# Patient Record
Sex: Male | Born: 1980 | Race: Black or African American | Hispanic: No | Marital: Married | Smoking: Current every day smoker
Health system: Southern US, Community
[De-identification: ages and names within clinical notes are randomized; demographics above are authoritative.]

## PROBLEM LIST (undated history)

## (undated) DIAGNOSIS — G510 Bell's palsy: Secondary | ICD-10-CM

## (undated) DIAGNOSIS — M779 Enthesopathy, unspecified: Secondary | ICD-10-CM

## (undated) HISTORY — DX: Bell's palsy: G51.0

---

## 2007-03-31 ENCOUNTER — Emergency Department (HOSPITAL_COMMUNITY): Admission: EM | Admit: 2007-03-31 | Discharge: 2007-03-31 | Payer: Self-pay | Admitting: Emergency Medicine

## 2008-05-17 ENCOUNTER — Emergency Department (HOSPITAL_COMMUNITY): Admission: EM | Admit: 2008-05-17 | Discharge: 2008-05-17 | Payer: Self-pay | Admitting: Emergency Medicine

## 2010-11-20 LAB — URINALYSIS, ROUTINE W REFLEX MICROSCOPIC
Bilirubin Urine: NEGATIVE
Glucose, UA: NEGATIVE
Hgb urine dipstick: NEGATIVE
Ketones, ur: NEGATIVE
Nitrite: NEGATIVE
Protein, ur: NEGATIVE
Specific Gravity, Urine: 1.029
Urobilinogen, UA: 1
pH: 6

## 2010-11-20 LAB — COMPREHENSIVE METABOLIC PANEL
ALT: 27
AST: 23
Alkaline Phosphatase: 49
CO2: 26
Chloride: 108
GFR calc Af Amer: >60
GFR calc non Af Amer: >60
Glucose, Bld: 87
Sodium: 138
Total Bilirubin: 1.5 — ABNORMAL HIGH

## 2010-11-20 LAB — CBC
Hemoglobin: 15
RBC: 4.83
WBC: 11.6 — ABNORMAL HIGH

## 2010-11-20 LAB — DIFFERENTIAL
Basophils Absolute: 0
Basophils Relative: 0
Eosinophils Absolute: 0.1
Eosinophils Relative: 1
Lymphocytes Relative: 9 — ABNORMAL LOW
Lymphs Abs: 1
Monocytes Absolute: 0.7
Monocytes Relative: 6
Neutro Abs: 9.7 — ABNORMAL HIGH
Neutrophils Relative %: 84 — ABNORMAL HIGH

## 2010-11-20 LAB — URINE MICROSCOPIC-ADD ON

## 2016-10-04 ENCOUNTER — Emergency Department (HOSPITAL_COMMUNITY): Payer: Self-pay

## 2016-10-04 ENCOUNTER — Encounter (HOSPITAL_COMMUNITY): Payer: Self-pay | Admitting: *Deleted

## 2016-10-04 ENCOUNTER — Emergency Department (HOSPITAL_COMMUNITY)
Admission: EM | Admit: 2016-10-04 | Discharge: 2016-10-04 | Disposition: A | Discharge status: Home / Self Care | Payer: Self-pay | Attending: Emergency Medicine | Admitting: Emergency Medicine

## 2016-10-04 DIAGNOSIS — M79672 Pain in left foot: Secondary | ICD-10-CM | POA: Insufficient documentation

## 2016-10-04 DIAGNOSIS — F17200 Nicotine dependence, unspecified, uncomplicated: Secondary | ICD-10-CM | POA: Insufficient documentation

## 2016-10-04 HISTORY — DX: Enthesopathy, unspecified: M77.9

## 2016-10-04 MED ORDER — MELOXICAM 15 MG PO TABS: 15.0000 mg | ORAL_TABLET | Freq: Every day | ORAL | 0 refills | Status: DC

## 2016-10-04 NOTE — Discharge Instructions (Signed)
Take Mobic once a day. Do not take other anti-inflammatories at the same time (Advil, Motrin, naproxen, Aleve, ibuprofen). You may supplement with Tylenol as needed. Continue to rest, ice, and elevate your foot. You may continue to use your ankle brace for support. Follow-up with orthopedics for further evaluation of your foot. Return to the emergency department if you develop fevers, chills, numbness, tingling, or any new or worsening symptoms.

## 2016-10-04 NOTE — ED Provider Notes (Signed)
[facility] Provider Note   CSN: [id number] Arrival date & time: 10/04/16  [id number]  By signing my name below, I, Vista Minkobert Ross, attest that this documentation has been prepared under the direction and in the presence of Brynn Mulgrew PA-C.  Electronically Signed: Vista Minkobert Ross, ED Scribe. 10/04/16. 10:18 AM.  History   Chief Complaint Chief Complaint  Patient presents with  . Foot Pain   HPI HPI Comments: Raymond Harrison is a 36 y.o. male who presents to the Emergency Department complaining of persistent sharp pain from the top of his left foot that started five days ago. 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 This caused his foot to roll (eversion). Pt took two ibuprofen after the incident with significant relief the same night but woke up in the morning with increased pain. He has also used an ankle brace and applied ice without relief. Pt has been taking ibuprofen every day since onset without relief. He reports an exacerbation of pain during ambulation and when bearing weight on the extremity. He does not have a current orthopedist to follow up with. Pt has Hx of ankle break in the same ankle, without need for surgical intervention. Pt denies any numbness or tingling in the toes or injury elsewhere.  The history is provided by the patient. No language interpreter was used.   Past Medical History:  Diagnosis Date  . Tendonitis    There are no active problems to display for this patient.  History reviewed. No pertinent surgical history.   Home Medications    Prior to Admission medications   Medication Sig Start Date End Date Taking? Authorizing Provider  meloxicam (MOBIC) 15 MG tablet Take 1 tablet (15 mg total) by mouth daily. 10/04/16   Takeya Marquis, PA-C    Family History History reviewed. No pertinent family history.  Social History Social History  Substance Use Topics  . Smoking status: Current Every Day Smoker  . Smokeless  tobacco: Not on file  . Alcohol use Yes     Comment: occ    Allergies   Patient has no known allergies.   Review of Systems Review of Systems  Musculoskeletal: Positive for arthralgias and gait problem.  Skin: Negative for color change, pallor and wound.  Neurological: Negative for weakness and numbness.     Physical Exam Updated Vital Signs BP 130/76 (BP Location: Right Arm)   Pulse 68   Temp 97.9 F (36.6 C) (Oral)   Resp 17   SpO2 96%   Physical Exam  Constitutional: He is oriented to person, place, and time. He appears well-developed and well-nourished. No distress.  HENT:  Head: Normocephalic and atraumatic.  Neck: Normal range of motion.  Pulmonary/Chest: Effort normal.  Musculoskeletal:       Left knee: Normal.       Left ankle: He exhibits no swelling, no deformity, no laceration and normal pulse. No lateral malleolus and no medial malleolus tenderness found. Achilles tendon normal.       Feet:  Patient without obvious swelling, contusion, laceration or injury of the left foot. Patient with full plantar and dorsal flexion active ROM without pain. Decreased eversion and inversion due to pain. Minimal tenderness to palpation of the dorsal medial aspect of the left foot. No pain in the ankle, lateral foot, or plantar foot. Full range of motion of the toes without pain. Pedal pulses intact bilaterally. Color and warmth equal bilaterally. Sensation intact bilaterally. Compartments soft.  Neurological: He is alert and oriented to person, place, and time.  Skin: Skin is warm and dry. He is not diaphoretic.  Psychiatric: He has a normal mood and affect. Judgment normal.  Nursing note and vitals reviewed.    ED Treatments / Results  DIAGNOSTIC STUDIES: Oxygen Saturation is 96% on RA, normal by my interpretation.  COORDINATION OF CARE: 10:16 AM-Discussed treatment plan with pt at bedside and pt agreed to plan.   Labs (all labs ordered are listed, but only abnormal  results are displayed) Labs Reviewed - No data to display  EKG  EKG Interpretation None      Radiology Dg Foot Complete Left  Result Date: 10/04/2016 CLINICAL DATA:  Left foot pain since an injury playing football 09/29/2016. Initial encounter. EXAM: LEFT FOOT - COMPLETE 3+ VIEW COMPARISON:  None. FINDINGS: There is no acute bony or joint abnormality. The patient has advanced for age degenerative change about the articulation of the navicular and medial cuneiform. There may be partial fusion across the articulation compatible with the coalition. Soft tissues are unremarkable. IMPRESSION: No acute finding. 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 Nonemergent consultation with orthopedics is recommended. Electronically Signed   By: Drusilla Kannerhomas  Dalessio M.D.   On: 10/04/2016 10:02   Procedures Procedures (including critical care time)  Medications Ordered in ED Medications - No data to display   Initial Impression / Assessment and Plan / ED Course  I have reviewed the triage vital signs and the nursing notes.  Pertinent labs & imaging results that were available during my care of the patient were reviewed by me and considered in my medical decision making (see chart for details).     Patient presenting with pain of the left foot. Physical exam showed tenderness of the medial dorsal ankle, with patient able to range his foot and toes. No surrounding erythema, swelling or signs of infection. Patient neurovascularly intact with soft compartments. 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 Discussed findings with patient. Will treat conservatively with NSAIDs. Patient already using a brace, and states that he can continue to use his own instead of getting one here. Patient to follow-up with orthopedics. Return precautions given. Patient states he  understands and agrees to plan.  Final Clinical Impressions(s) / ED Diagnoses   Final diagnoses:  Left foot pain    New Prescriptions Discharge Medication List as of 10/04/2016 10:27 AM    START taking these medications   Details  meloxicam (MOBIC) 15 MG tablet Take 1 tablet (15 mg total) by mouth daily., Starting Sun 10/04/2016, Print      I personally performed the services described in this documentation, which was scribed in my presence. The recorded information has been reviewed and is accurate.    Alveria ApleyCaccavale, Shyne Resch, PA-C 10/04/16 1112    Rolan BuccoBelfi, Melanie, MD 10/04/16 [phone]

## 2016-10-04 NOTE — ED Triage Notes (Signed)
Pt reports having left foot pain since Tuesday. 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 Now has difficulty bearing weight and no relief with tylenol and ibuprofen.

## 2016-10-21 ENCOUNTER — Ambulatory Visit (INDEPENDENT_AMBULATORY_CARE_PROVIDER_SITE_OTHER): Payer: Self-pay | Admitting: Family Medicine

## 2016-10-21 ENCOUNTER — Encounter: Payer: Self-pay | Admitting: Sports Medicine

## 2016-10-21 VITALS — BP 124/82 | Ht 70.0 in | Wt 217.0 lb

## 2016-10-21 DIAGNOSIS — M216X1 Other acquired deformities of right foot: Secondary | ICD-10-CM

## 2016-10-21 DIAGNOSIS — Q6689 Other  specified congenital deformities of feet: Secondary | ICD-10-CM

## 2016-10-21 DIAGNOSIS — M216X2 Other acquired deformities of left foot: Secondary | ICD-10-CM

## 2016-10-21 NOTE — Progress Notes (Signed)
Chief complaint: Left foot pain 2 months  History of present illness: Raymond Harrison is a 36 year old male who presents to the sports medicine office today with chief complaint of left foot pain. He reports symptoms have been ongoing for the last 2 months. He reports of gradual, progressive worsening of left dorsal foot pain during this time. He is not report of inciting incident, trauma, or injury to explain the pain. He describes the pain as a sharp and throbbing pain. Today, he rates the pain as a 2/10. He does not report of any radiation of pain. He reports his pain to the medial aspect of his left foot over the navicular bone. He does not report of any warmth, erythema, ecchymosis, or effusion. 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 He reports that he tried to manage the pain on his own with rest, ice, and ibuprofen use, but pain became so severe that he did have to go to the emergency department on 10/04/16. In the emergency department note, it was mentioned he had forced eversion injury. He does not report of any such incidence. 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 It was recommended for him to follow-up with sports medicine for further evaluation. He reports that he did play football throughout high school, reports having minor sprains, but no major foot or ankle injuries. He does not report of any numbness, tingling, or weakness in his left lower extremity. Has tried an ankle brace, describes a lace up ankle brace, with no improvement in his symptoms.  His past medical history, past surgical history, family history, social history were reviewed  and updated in the chart. No notable medical conditions, no previous surgeries on his feet or ankle, he does report of current tobacco use.  Allergies: No known drug allergies  Review of systems: As stated above  Physical exam: Vital signs reviewed and are documented in chart General: Alert, oriented, appears stated age, in no apparent distress HEENT: Moist oral mucosa Respiratory: Normal respirations, able to speak in full sentences Cardiac: Normal peripheral pulses, regular rate Integumentary: No rashes on visible skin Neurologic: Strength 5/5 in bilateral lower extremities, sensation 2+ in bilateral lower extremities Psychiatric: Appropriate affect and mood Musculoskeletal: Inspection of the foot reveals no obvious deformity or muscular atrophy, no warmth, erythema, ecchymosis, or effusion noted, he is slightly tender to deep palpation over the cuneiforms and navicular on dorsal aspect, no tenderness along medial aspect of foot as well as plantar aspect of left foot, no tenderness over proximal second metatarsal head, no tenderness over base of fifth metatarsal, he does have full active and passive range of motion with ankle dorsiflexion, plantar flexion, inversion, eversion, anterior drawer negative, talar tilt negative, on gait analysis he does have pes cavus bilaterally as well as supination bilaterally  I did personally review x-ray imaging from the emergency department. Appears that he does have possible coalition of the navicular and medial cuneiform. In the absence of any notable trauma or injury, have low suspicion that this represents a fracture. An incidental finding he does have os cuboid.  Assessment and plan:  1. Left foot pain,  with XR findings of navicular-medial cuneiform coalition 2. Bilateral pes cavus, with supination  Left foot pain -Discussed with patient x-ray findings of navicular-medial cuneiform coalition, discussed that this is abnormal bony or ligamentous  fusion, this most likely has been present since birth and is not a new bony abnormality -Discussed conservative management, did fit patient today with green sport insoles with lateral heel wedge -Discussed in the absence of trauma and slight improvement in his symptoms since emergency department visit, discussed he does not need any additional imaging at this time -I discussed with him if the pain worsens or does not improve with the insoles in 4 weeks, will obtain CT imaging of his left foot -I discussed with patient to use Aleve as needed for pain  Will have patient return in 4 weeks for the above or sooner as needed.    Haynes Kerns, MD Primary Care Sports Medicine Fellow [facility]

## 2016-10-21 NOTE — Patient Instructions (Signed)
Please wear your orthotics at all times. Remove the insoles in your shoes and place this in there. You can take Aleve as needed for pain. If you are not getting better in 4 weeks, we will get CT imaging of your left foot.

## 2016-11-18 ENCOUNTER — Ambulatory Visit (INDEPENDENT_AMBULATORY_CARE_PROVIDER_SITE_OTHER): Payer: Self-pay | Admitting: Family Medicine

## 2016-11-18 VITALS — BP 106/70 | Ht 70.0 in | Wt 215.0 lb

## 2016-11-18 DIAGNOSIS — Q6689 Other  specified congenital deformities of feet: Secondary | ICD-10-CM

## 2016-11-18 NOTE — Progress Notes (Signed)
Chief complaint: Follow-up the left foot pain 3 months  History of present illness: Sheikh is a 36 year old male who presents to the sports medicine office today for follow-up of left foot pain. He was here approximately 1 month ago for left dorsal foot pain. At that time, he was diagnosed with navicular-medial cuneiform coalition, as seen on x-ray from emergency department visit. Ultimately discussed conservative management, with green sport insoles and lateral heel wedge.  Today, he reports significant improvement in his symptoms. He is not report of any pain today. 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 He reports that he has been wearing the green insoles, has not noted any issues. He reports occasionally will feel a slight twinge on the lateral aspect with inversion, but he notes that he is wearing tightfitting shoes, does not notices when he is wearing more wider fitting shoes. He reports that he is planning to get more wider fitting shoes. No numbness, tingling, or weakness.    Review of systems:  As stated above  Physical exam: Vital signs are reviewed and are documented in the chart Gen.: Alert, oriented, appears stated age, in no apparent distress HEENT: Moist oral mucosa Respiratory: Normal respirations, able to speak in full sentences Cardiac: Regular rate, distal pulses 2+ Integumentary: No rashes on visible skin:  Neurologic: Strength 5/5, sensation 2+ in bilateral lower extremities Psych: Normal affect, mood is described as good Musculoskeletal: Inspection of his left foot reveals no obvious deformity or muscle atrophy, no warmth, erythema, ecchymosis, or effusion, he is not tender to palpation today over the cuneiform and navicular bone on dorsal aspect, no pain with ambulation, has full range of motion, do note that he has pes cavus bilaterally and supination with ambulation  Assessment and plan: 1. Left foot pain, with x-ray findings of  navicular-medial cuneiform coalition, resolved  Left foot pain -Discussed to continue wearing green insoles -Discussed option of custom orthotics if he would like to have this done at a later time  Will have patient follow-up on as-needed basis.    Haynes Kerns, M.D. Primary Care Sports Medicine Fellow [facility]

## 2016-11-18 NOTE — Patient Instructions (Signed)
I am glad to hear that you are doing better. Still free to call the office if he would like to have custom orthotics made or have any other orthopedic issues you would like to address.

## 2017-01-25 ENCOUNTER — Encounter (HOSPITAL_COMMUNITY): Payer: Self-pay | Admitting: Emergency Medicine

## 2017-01-25 ENCOUNTER — Ambulatory Visit (HOSPITAL_COMMUNITY)
Admission: EM | Admit: 2017-01-25 | Discharge: 2017-01-25 | Disposition: A | Discharge status: Home / Self Care | Payer: Self-pay | Attending: Physician Assistant | Admitting: Physician Assistant

## 2017-01-25 DIAGNOSIS — R6889 Other general symptoms and signs: Secondary | ICD-10-CM

## 2017-01-25 DIAGNOSIS — B349 Viral infection, unspecified: Secondary | ICD-10-CM

## 2017-01-25 MED ORDER — BENZONATATE 100 MG PO CAPS: 100.0000 mg | ORAL_CAPSULE | Freq: Three times a day (TID) | ORAL | 0 refills | Status: DC

## 2017-01-25 MED ORDER — PREDNISONE 10 MG PO TABS: 40.0000 mg | ORAL_TABLET | Freq: Every day | ORAL | 0 refills | Status: AC

## 2017-01-25 NOTE — ED Provider Notes (Signed)
[facility]    CSN: [id number] Arrival date & time: 01/25/17  1109     History   Chief Complaint Chief Complaint  Patient presents with  . Cough  . Generalized Body Aches    HPI Raymond Harrison is a 36 y.o. male.   36 year-old male, presenting today due to cough and body aches. Symptoms started 4 days ago. He has had nasal congestion, body aches, nonproductive cough, subjective fever and chills. 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  Influenza  Presenting symptoms: cough, fatigue, myalgias and rhinorrhea   Presenting symptoms: no fever, no shortness of breath, no sore throat and no vomiting   Severity:  Moderate Onset quality:  Gradual Duration:  4 days Progression:  Unchanged Chronicity:  New Relieved by:  Nothing Worsened by:  Nothing Ineffective treatments:  None tried Associated symptoms: chills, decreased appetite, decreased physical activity and nasal congestion   Associated symptoms: no ear pain, no mental status change, no neck stiffness and no syncope   Risk factors: sick contacts   Risk factors: not elderly, no diabetes problem, no heart disease and no immunocompromised state     Past Medical History:  Diagnosis Date  . Tendonitis     There are no active problems to display for this patient.   History reviewed. No pertinent surgical history.     Home Medications    Prior to Admission medications   Medication Sig Start Date End Date Taking? Authorizing Provider  benzonatate (TESSALON) 100 MG capsule Take 1 capsule (100 mg total) by mouth every 8 (eight) hours. 01/25/17   Aubrii Sharpless C, PA-C  meloxicam (MOBIC) 15 MG tablet Take 1 tablet (15 mg total) by mouth daily. Patient not taking: Reported on 10/21/2016 10/04/16   Caccavale, Sophia, PA-C  predniSONE  (DELTASONE) 10 MG tablet Take 4 tablets (40 mg total) by mouth daily for 5 days. 01/25/17 01/30/17  Alecia LemmingBlue, Jamiesha Victoria C, PA-C    Family History History reviewed. No pertinent family history.  Social History Social History   Tobacco Use  . Smoking status: Current Every Day Smoker  Substance Use Topics  . Alcohol use: Yes    Comment: occ  . Drug use: No     Allergies   Patient has no known allergies.   Review of Systems Review of Systems  Constitutional: Positive for chills, decreased appetite and fatigue. Negative for fever.  HENT: Positive for congestion and rhinorrhea. Negative for ear pain and sore throat.   Eyes: Negative for pain and visual disturbance.  Respiratory: Positive for cough. Negative for shortness of breath.   Cardiovascular: Negative for chest pain and palpitations.  Gastrointestinal: Negative for abdominal pain and vomiting.  Genitourinary: Negative for dysuria and hematuria.  Musculoskeletal: Positive for myalgias. Negative for arthralgias, back pain and neck stiffness.  Skin: Negative for color change and rash.  Neurological: Negative for seizures and syncope.  All other systems reviewed and are negative.    Physical Exam Triage Vital Signs ED Triage Vitals [01/25/17 1135]  Enc Vitals Group     BP (!) 124/99     Pulse Rate 72     Resp 18     Temp 98.3 F (36.8 C)     Temp Source Oral     SpO2 99 %  Weight      Height      Head Circumference      Peak Flow      Pain Score      Pain Loc      Pain Edu?      Excl. in GC?    No data found.  Updated Vital Signs BP (!) 124/99 (BP Location: Left Arm)   Pulse 72   Temp 98.3 F (36.8 C) (Oral)   Resp 18   SpO2 99%   Visual Acuity Right Eye Distance:   Left Eye Distance:   Bilateral Distance:    Right Eye Near:   Left Eye Near:    Bilateral Near:     Physical Exam  Constitutional: He appears well-developed and well-nourished.  HENT:  Head: Normocephalic and atraumatic.  Right Ear:  Hearing, tympanic membrane, external ear and ear canal normal.  Left Ear: Hearing, tympanic membrane, external ear and ear canal normal.  Nose: Nose normal.  Mouth/Throat: Uvula is midline and oropharynx is clear and moist.  Eyes: Conjunctivae are normal.  Neck: Neck supple.  Cardiovascular: Normal rate and regular rhythm.  No murmur heard. Pulmonary/Chest: Effort normal and breath sounds normal. No stridor. No respiratory distress. He has no decreased breath sounds. He has no wheezes. He has no rhonchi. He has no rales.  Abdominal: Soft. There is no tenderness.  Musculoskeletal: He exhibits no edema.  Neurological: He is alert.  Skin: Skin is warm and dry.  Psychiatric: He has a normal mood and affect.  Nursing note and vitals reviewed.    UC Treatments / Results  Labs (all labs ordered are listed, but only abnormal results are displayed) Labs Reviewed - No data to display  EKG  EKG Interpretation None       Radiology No results found.  Procedures Procedures (including critical care time)  Medications Ordered in UC Medications - No data to display   Initial Impression / Assessment and Plan / UC Course  I have reviewed the triage vital signs and the nursing notes.  Pertinent labs & imaging results that were available during my care of the patient were reviewed by me and considered in my medical decision making (see chart for details).     Flu-like symptoms x 4 days Outside of the window for tamiflu Will treat symptomatically for viral syndrome   Final Clinical Impressions(s) / UC Diagnoses   Final diagnoses:  Flu-like symptoms  Viral syndrome    ED Discharge Orders        Ordered    benzonatate (TESSALON) 100 MG capsule  Every 8 hours     01/25/17 1218    predniSONE (DELTASONE) 10 MG tablet  Daily     01/25/17 1218       Controlled Substance Prescriptions [place] Controlled Substance Registry consulted? Not Applicable   Alecia LemmingBlue, Obadiah Dennard C, [place]PA-C 01/25/17  1243

## 2017-01-25 NOTE — ED Triage Notes (Signed)
Pt sts cough and body aches x 4 days 

## 2017-05-20 ENCOUNTER — Encounter (HOSPITAL_COMMUNITY): Payer: Self-pay

## 2017-05-20 ENCOUNTER — Emergency Department (HOSPITAL_COMMUNITY)
Admission: EM | Admit: 2017-05-20 | Discharge: 2017-05-20 | Disposition: A | Discharge status: Home / Self Care | Payer: BLUE CROSS/BLUE SHIELD | Attending: Emergency Medicine | Admitting: Emergency Medicine

## 2017-05-20 ENCOUNTER — Other Ambulatory Visit: Payer: Self-pay

## 2017-05-20 DIAGNOSIS — Z041 Encounter for examination and observation following transport accident: Secondary | ICD-10-CM | POA: Insufficient documentation

## 2017-05-20 DIAGNOSIS — Z79899 Other long term (current) drug therapy: Secondary | ICD-10-CM | POA: Diagnosis not present

## 2017-05-20 DIAGNOSIS — F17200 Nicotine dependence, unspecified, uncomplicated: Secondary | ICD-10-CM | POA: Diagnosis not present

## 2017-05-20 MED ORDER — CYCLOBENZAPRINE HCL 10 MG PO TABS: 10.0000 mg | ORAL_TABLET | Freq: Two times a day (BID) | ORAL | 0 refills | Status: DC | PRN

## 2017-05-20 NOTE — ED Notes (Signed)
Pt ambulated to room without difficulty

## 2017-05-20 NOTE — Discharge Instructions (Addendum)
Please read attached information. If you experience any new or worsening signs or symptoms please return to the emergency room for evaluation. Please follow-up with your primary care provider or specialist as discussed. Please use medication prescribed only as directed and discontinue taking if you have any concerning signs or symptoms.   °
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
## 2018-12-13 IMAGING — DX DG FOOT COMPLETE 3+V*L*
3 series · 3 of 3 positions shown · non-contrast
Comparison: None.

CLINICAL DATA: Left foot pain since an injury playing football
09/29/2016. Initial encounter.

EXAM:
LEFT FOOT - COMPLETE 3+ VIEW

[foot ap]
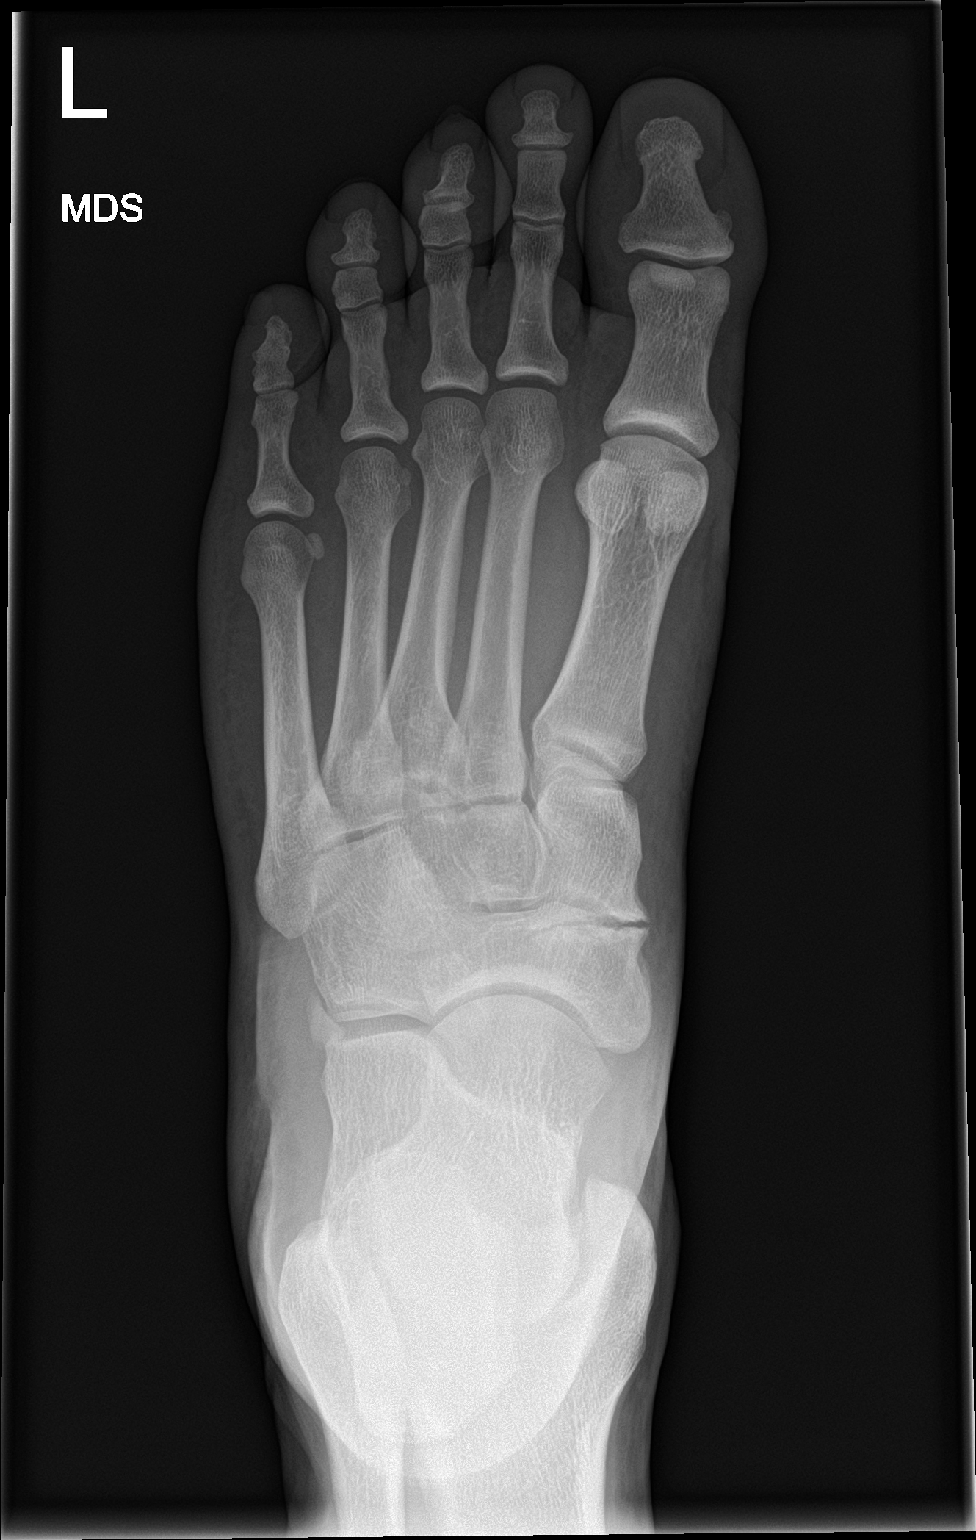

[foot obl]
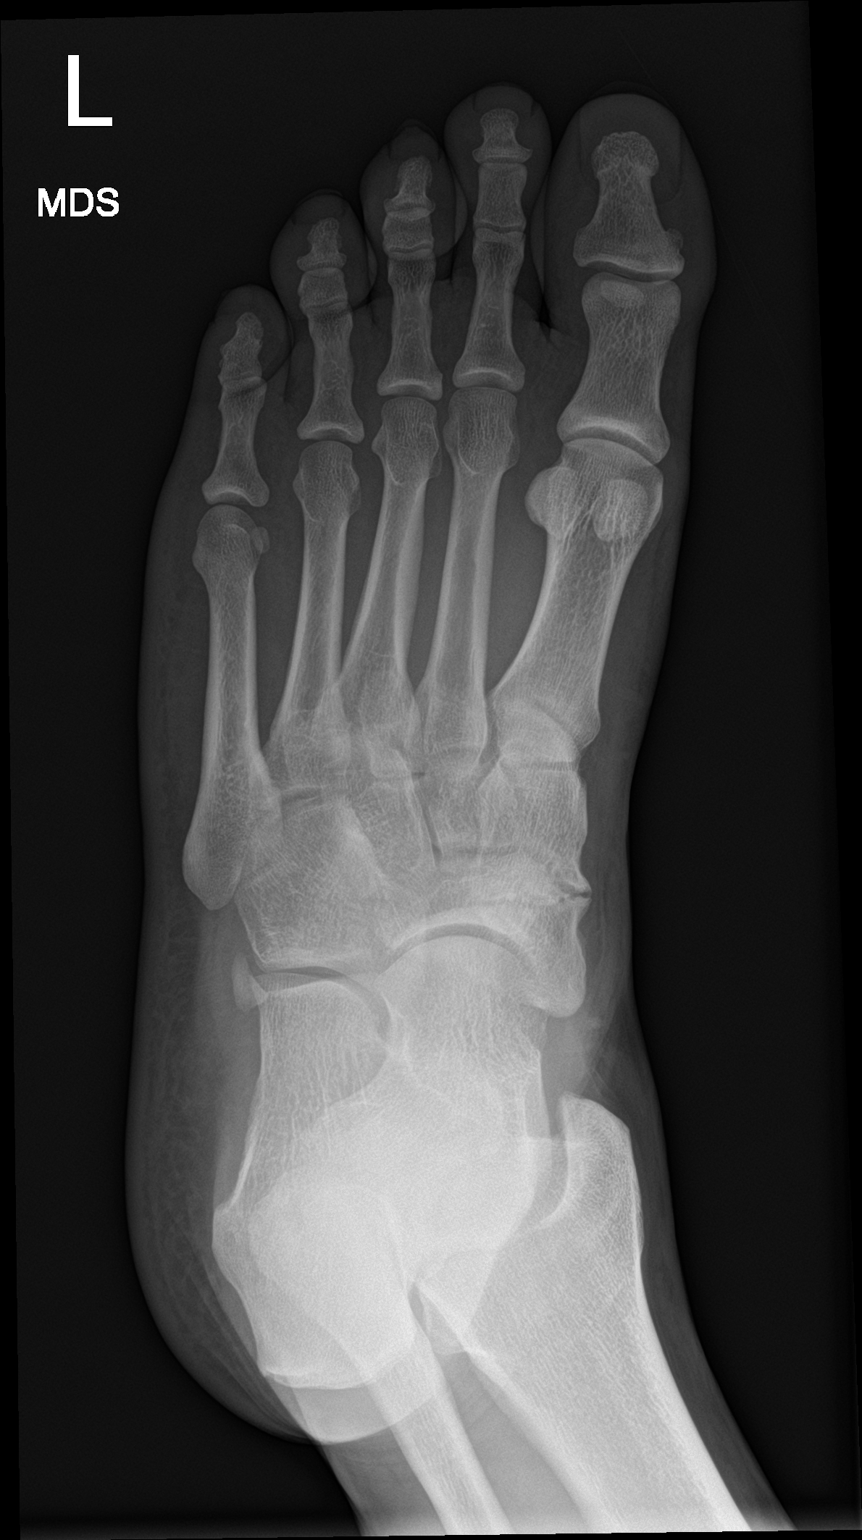

[foot lat]
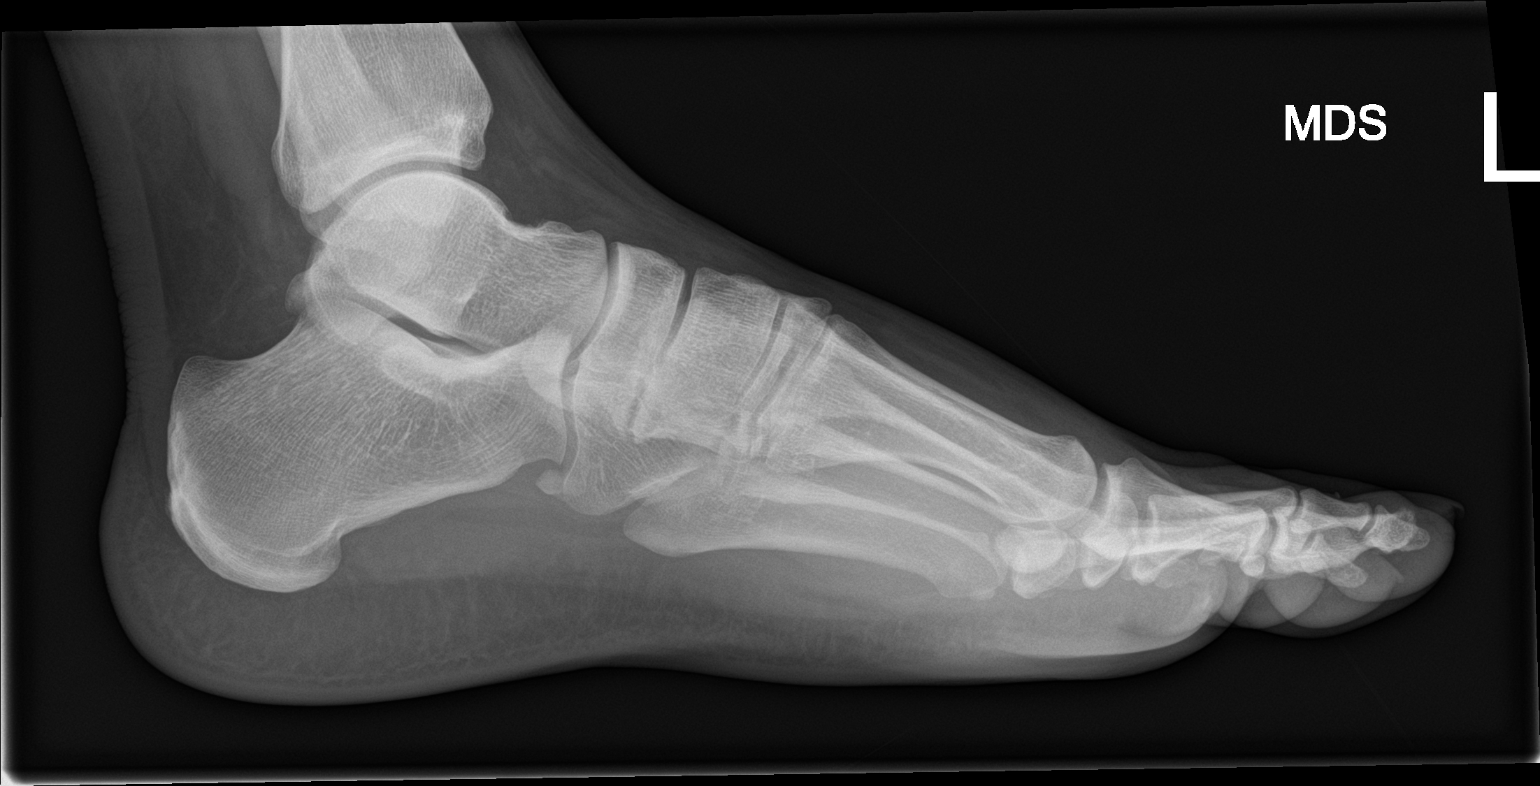

[3 of 3 positions shown; findings below may reference images not displayed]

FINDINGS: There is no acute bony or joint abnormality. The patient has
advanced for age degenerative change about the articulation of the
navicular and medial cuneiform. There may be partial fusion across
the articulation compatible with the coalition. Soft tissues are
unremarkable.
IMPRESSION: No acute finding.

Possible coalition of the navicular and medial cuneiform with
secondary advanced for age degenerative disease about the joint.
Nonemergent consultation with orthopedics is recommended.

## 2021-03-17 ENCOUNTER — Emergency Department (HOSPITAL_COMMUNITY)
Admission: EM | Admit: 2021-03-17 | Discharge: 2021-03-18 | Discharge status: Against Medical Advice | Payer: BLUE CROSS/BLUE SHIELD | Attending: Student | Admitting: Student

## 2021-03-17 DIAGNOSIS — Z5321 Procedure and treatment not carried out due to patient leaving prior to being seen by health care provider: Secondary | ICD-10-CM | POA: Insufficient documentation

## 2021-03-17 DIAGNOSIS — R519 Headache, unspecified: Secondary | ICD-10-CM | POA: Insufficient documentation

## 2021-03-17 DIAGNOSIS — Z20822 Contact with and (suspected) exposure to covid-19: Secondary | ICD-10-CM | POA: Insufficient documentation

## 2021-03-17 DIAGNOSIS — R221 Localized swelling, mass and lump, neck: Secondary | ICD-10-CM | POA: Insufficient documentation

## 2021-03-17 DIAGNOSIS — M542 Cervicalgia: Secondary | ICD-10-CM | POA: Insufficient documentation

## 2021-03-18 LAB — RESP PANEL BY RT-PCR (FLU A&B, COVID) ARPGX2
Influenza A by PCR: NEGATIVE
Influenza B by PCR: NEGATIVE
SARS Coronavirus 2 by RT PCR: NEGATIVE

## 2021-03-18 MED ORDER — ACETAMINOPHEN 325 MG PO TABS: 650.0000 mg | ORAL_TABLET | Freq: Once | ORAL | Status: DC

## 2021-03-18 NOTE — ED Provider Triage Note (Signed)
Emergency Medicine Provider Triage Evaluation Note  Raymond Harrison , a 41 y.o. male  was evaluated in triage.  Pt complains of neck pain & headache. Notes a small painful mass to the right side of his neck which he thinks is causing a headache. No alleviating/aggravating factors.   Review of Systems  Positive: Headache, neck pain.  Negative: Fever, sore throat, ear pain, vomiting   Physical Exam  BP 128/88    Pulse 75    Temp 98.6 F (37 C) (Oral)    Resp 16    SpO2 98%  Gen:   Awake, no distress   Resp:  Normal effort  MSK:   Moves extremities without difficulty Neck:   No overlying skin erythema or increased warmth. No meningismus.   Medical Decision Making  Medically screening exam initiated at 12:10 AM.  Appropriate orders placed.  Raymond Harrison was informed that the remainder of the evaluation will be completed by another provider, this initial triage assessment does not replace that evaluation, and the importance of remaining in the ED until their evaluation is complete.  Headache.    Cherly Anderson, PA-C 03/18/21 0013

## 2021-03-18 NOTE — ED Notes (Signed)
Patient states he didn't want to wait and is leaving

## 2021-03-18 NOTE — ED Triage Notes (Signed)
Patient arrives POV c/o lump on right side of neck that is giving him a headache. Patient states he took advil/tylenol for headache Denies any sorethroat, stuffy nose or cough

## 2022-02-04 ENCOUNTER — Other Ambulatory Visit: Payer: Self-pay

## 2022-02-04 ENCOUNTER — Emergency Department (HOSPITAL_COMMUNITY)
Admission: EM | Admit: 2022-02-04 | Discharge: 2022-02-04 | Disposition: A | Discharge status: Home / Self Care | Payer: Self-pay | Attending: Emergency Medicine | Admitting: Emergency Medicine

## 2022-02-04 ENCOUNTER — Encounter (HOSPITAL_COMMUNITY): Payer: Self-pay

## 2022-02-04 DIAGNOSIS — G510 Bell's palsy: Secondary | ICD-10-CM | POA: Insufficient documentation

## 2022-02-04 MED ORDER — PREDNISONE 20 MG PO TABS: ORAL_TABLET | ORAL | 0 refills | Status: DC

## 2022-02-04 MED ORDER — HYPROMELLOSE (GONIOSCOPIC) 2.5 % OP SOLN: 1.0000 [drp] | Freq: Three times a day (TID) | OPHTHALMIC | 12 refills | Status: DC

## 2022-02-04 MED ORDER — VALACYCLOVIR HCL 1 G PO TABS: 1000.0000 mg | ORAL_TABLET | Freq: Three times a day (TID) | ORAL | 0 refills | Status: DC

## 2022-02-04 NOTE — ED Provider Notes (Signed)
[facility] EMERGENCY DEPARTMENT Provider Note   CSN: [id number] Arrival date & time: 02/04/22  1223     History  Chief Complaint  Patient presents with   Facial Droop    Raymond Harrison is a 41 y.o. male.  The history is provided by the patient and medical records. No language interpreter was used.     41 year old male without any significant past medical history presenting complaints of facial droop.  Patient reported noticed right-sided facial droop for the past 3 days.  Does notice having some trouble with swallowing as he is unable to close his mouth appropriately.  He endorsed some tingling sensation to the right side of his face.  He denies any significant headache, vision changes, confusion, focal numbness or focal weakness or neck pain.  No recent sickness.  Denies tobacco or alcohol use.  No other complaint.  Home Medications Prior to Admission medications   Medication Sig Start Date End Date Taking? Authorizing Provider  benzonatate (TESSALON) 100 MG capsule Take 1 capsule (100 mg total) by mouth every 8 (eight) hours. 01/25/17   Blue, Olivia C, PA-C  cyclobenzaprine (FLEXERIL) 10 MG tablet Take 1 tablet (10 mg total) by mouth 2 (two) times daily as needed for muscle spasms. 05/20/17   Hedges, Tinnie Gens, PA-C  meloxicam (MOBIC) 15 MG tablet Take 1 tablet (15 mg total) by mouth daily. Patient not taking: Reported on 10/21/2016 10/04/16   Caccavale, Jeanette Caprice, PA-C      Allergies    Patient has no known allergies.    Review of Systems   Review of Systems  All other systems reviewed and are negative.   Physical Exam Updated Vital Signs BP 115/81   Pulse 76   Temp 97.9 F (36.6 C)   Resp 16   Ht 5\' 10"  (1.778 m)   Wt 107 kg   SpO2 100%   BMI 33.86 kg/m  Physical Exam Vitals and nursing note reviewed.  Constitutional:      General: He is not in acute distress.    Appearance: He is well-developed.  HENT:     Head: Atraumatic.  Eyes:      Conjunctiva/sclera: Conjunctivae normal.  Cardiovascular:     Rate and Rhythm: Normal rate and regular rhythm.     Pulses: Normal pulses.     Heart sounds: Normal heart sounds.  Pulmonary:     Effort: Pulmonary effort is normal.     Breath sounds: Normal breath sounds.  Abdominal:     Palpations: Abdomen is soft.     Tenderness: There is no abdominal tenderness.  Musculoskeletal:        General: Normal range of motion.     Cervical back: Neck supple.  Skin:    Findings: No rash.  Neurological:     Mental Status: He is alert and oriented to person, place, and time.     Comments: Neurologic exam:  Speech clear, pupils equal round reactive to light, extraocular movements intact  Normal peripheral visual fields Cranial nerves III through XII grossly intact except R side facial droop involving forehead Follows commands, moves all extremities x4, normal strength to bilateral upper and lower extremities at all major muscle groups including grip Sensation normal to light touch and pinprick Coordination intact, no limb ataxia, finger-nose-finger normal Rapid alternating movements normal No pronator drift Gait normal   Psychiatric:        Mood and Affect: Mood normal.     ED Results / Procedures / Treatments  Labs (all labs ordered are listed, but only abnormal results are displayed) Labs Reviewed - No data to display  EKG None  Radiology No results found.  Procedures Procedures    Medications Ordered in ED Medications - No data to display  ED Course/ Medical Decision Making/ A&P                           Medical Decision Making  BP 115/81   Pulse 76   Temp 97.9 F (36.6 C)   Resp 16   Ht 5\' 10"  (1.778 m)   Wt 107 kg   SpO2 100%   BMI 33.86 kg/m   19:53 PM   41 year old male without any significant past medical history presenting complaints of facial droop.  Patient reported noticed right-sided facial droop for the past 3 days.  Does notice having some  trouble with swallowing as he is unable to close his mouth appropriately.  He endorsed some tingling sensation to the right side of his face.  He denies any significant headache, vision changes, confusion, focal numbness or focal weakness or neck pain.  No recent sickness.  Denies tobacco or alcohol use.  No other complaint.  On exam this is a well-appearing male sitting comfortably in bed appears to be in no acute discomfort.  His exam is notable for right-sided facial droop involving the forehead with some diminished sensation.  He is able to blink.  He does not have any other neurodeficit.  He has equal strength throughout.  His symptoms are suggestive of Bell's palsy.  I have considered stroke but felt that this is not likely.  I also discussed care with attending Dr. Roderic Palau who has seen evaluate patient and agrees.  Will discharge home with steroid, antiviral medication, as well as outpatient follow-up with neurology for further care.  Encourage patient to use artificial tears drops to his eyes to decrease risks of eye dryness.  I gave patient strict return precaution.  I have considered obtaining labs, brain MRI to rule out stroke but his symptoms is suggestive of Bell's palsy.  I have considered patient social determinant of health which includes tobacco use and recommend cessation.  I have reviewed patient EMR and considered in my plan of care.        Final Clinical Impression(s) / ED Diagnoses Final diagnoses:  Bell's palsy    Rx / DC Orders ED Discharge Orders          Ordered    predniSONE (DELTASONE) 20 MG tablet        02/04/22 1409    valACYclovir (VALTREX) 1000 MG tablet  3 times daily        02/04/22 1409    hydroxypropyl methylcellulose / hypromellose (ISOPTO TEARS / GONIOVISC) 2.5 % ophthalmic solution  3 times daily        02/04/22 1409              Domenic Moras, PA-C 02/04/22 1410    Milton Ferguson, MD 02/08/22 [phone]

## 2022-02-04 NOTE — ED Triage Notes (Signed)
Pt arrived POV from home c/o left sided facial droop since Monday. Pt denies any pain, numbness or tingling. Pt denies any other neuro symptoms.

## 2022-02-19 ENCOUNTER — Encounter: Payer: Self-pay | Admitting: Neurology

## 2022-02-19 ENCOUNTER — Ambulatory Visit: Payer: BC Managed Care – PPO | Admitting: Neurology

## 2022-02-19 VITALS — BP 137/82 | HR 74 | Ht 70.0 in | Wt 242.0 lb

## 2022-02-19 DIAGNOSIS — G510 Bell's palsy: Secondary | ICD-10-CM
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
## 2022-02-19 NOTE — Patient Instructions (Signed)
Return if worse

## 2022-04-16 ENCOUNTER — Ambulatory Visit (HOSPITAL_COMMUNITY)
Admission: EM | Admit: 2022-04-16 | Discharge: 2022-04-16 | Disposition: A | Discharge status: Home / Self Care | Payer: BC Managed Care – PPO | Attending: Emergency Medicine | Admitting: Emergency Medicine

## 2022-04-16 ENCOUNTER — Encounter (HOSPITAL_COMMUNITY): Payer: Self-pay

## 2022-04-16 DIAGNOSIS — H9202 Otalgia, left ear: Secondary | ICD-10-CM | POA: Diagnosis not present

## 2022-04-16 MED ORDER — CYCLOBENZAPRINE HCL 10 MG PO TABS: 10.0000 mg | ORAL_TABLET | Freq: Every day | ORAL | 0 refills | Status: DC

## 2022-04-16 MED ORDER — NAPROXEN 500 MG PO TABS: 500.0000 mg | ORAL_TABLET | Freq: Two times a day (BID) | ORAL | 0 refills | Status: DC

## 2022-04-16 MED ORDER — FLUTICASONE PROPIONATE 50 MCG/ACT NA SUSP: 2.0000 | Freq: Every day | NASAL | 0 refills | Status: DC

## 2022-04-16 NOTE — ED Triage Notes (Signed)
Pt states ringing in his left ear and pain for the past 2 days.

## 2022-04-16 NOTE — ED Provider Notes (Signed)
HPI  SUBJECTIVE:  Raymond Harrison is a 42 y.o. male who presents with 1.5 days of constant, pulsating left ear pain.  He states that things started off with a left-sided sore throat/scratchiness, but has now migrated to his ear.  The sore throat has resolved.  He reports a left-sided headache.  No change in hearing, otorrhea, tinnitus, sensation of fluid in the ear, vertigo, fevers, nasal congestion, rhinorrhea or allergy symptoms.  No recent swimming, foreign body insertion.  No popping or clicking of the jaw.  He grinds his teeth at night.  No antibiotics in the past month.  No antipyretic in the past 6 hours.  He tried sleeping without improvement in his symptoms.  No aggravating factors.  He has a past medical history of Bell's palsy on the right side and seasonal allergies.  PCP: None.   Past Medical History:  Diagnosis Date   Tendonitis     History reviewed. No pertinent surgical history.  History reviewed. No pertinent family history.  Social History   Tobacco Use   Smoking status: Every Day   Smokeless tobacco: Never  Substance Use Topics   Alcohol use: Yes    Comment: occ   Drug use: No    No current facility-administered medications for this encounter.  Current Outpatient Medications:    cyclobenzaprine (FLEXERIL) 10 MG tablet, Take 1 tablet (10 mg total) by mouth at bedtime., Disp: 20 tablet, Rfl: 0   fluticasone (FLONASE) 50 MCG/ACT nasal spray, Place 2 sprays into both nostrils daily., Disp: 16 g, Rfl: 0   naproxen (NAPROSYN) 500 MG tablet, Take 1 tablet (500 mg total) by mouth 2 (two) times daily., Disp: 20 tablet, Rfl: 0   hydroxypropyl methylcellulose / hypromellose (ISOPTO TEARS / GONIOVISC) 2.5 % ophthalmic solution, Place 1 drop into both eyes 3 (three) times daily., Disp: 15 mL, Rfl: 12   predniSONE (DELTASONE) 20 MG tablet, 3 tabs po day one, then 2 tabs daily x 4 days, Disp: 11 tablet, Rfl: 0   valACYclovir (VALTREX) 1000 MG tablet, Take 1 tablet (1,000 mg total)  by mouth 3 (three) times daily., Disp: 21 tablet, Rfl: 0  No Known Allergies   ROS  As noted in HPI.   Physical Exam  BP 107/73 (BP Location: Right Arm)   Pulse 75   Temp 98.6 F (37 C) (Oral)   Resp 16   SpO2 96%   Constitutional: Well developed, well nourished, no acute distress Eyes:  EOMI, conjunctiva normal bilaterally HENT: Normocephalic, atraumatic,mucus membranes moist.  Positive nasal congestion.  Left ear: external ear, EAC, TM normal.  No pain with traction on pinna, palpation of mastoid.  Pain with palpation of tragus.  No TMJ tenderness, crepitus.  Decreased hearing on the left compared to right.  Right TM normal Neck: No cervical lymphadenopathy respiratory: Normal inspiratory effort Cardiovascular: Normal rate GI: nondistended skin: No facial rash, skin intact Musculoskeletal: no deformities Neurologic: Alert & oriented x 3, no focal neuro deficits Psychiatric: Speech and behavior appropriate   ED Course   Medications - No data to display  Orders Placed This Encounter  Procedures   Nursing Communication Please set up with a PCP prior to discharge    Please set up with a PCP prior to discharge    Standing Status:   Standing    Number of Occurrences:   1    No results found for this or any previous visit (from the past 24 hour(s)). No results found.  ED Clinical Impression  1. Otalgia of left ear      ED Assessment/Plan     Patient presents with a left-sided otalgia.  Suspect eustachian tube dysfunction as he has allergies versus TMJ arthralgia since he grinds his teeth.  No evidence of otitis externa, otitis media, shingles at this time.  Will send home with an antihistamine such as Claritin, Allegra or Zyrtec, Flonase, Naprosyn/Tylenol, soft diet for the next few days,  Flexeril to take at night.  Follow-up with a dentist of choice to get a bite guard.  Will have staff set him up with a primary care provider prior to discharge.  Work note to  return tomorrow.  Discussed MDM, treatment plan, and plan for follow-up with patient.  patient agrees with plan.   Meds ordered this encounter  Medications   cyclobenzaprine (FLEXERIL) 10 MG tablet    Sig: Take 1 tablet (10 mg total) by mouth at bedtime.    Dispense:  20 tablet    Refill:  0   naproxen (NAPROSYN) 500 MG tablet    Sig: Take 1 tablet (500 mg total) by mouth 2 (two) times daily.    Dispense:  20 tablet    Refill:  0   fluticasone (FLONASE) 50 MCG/ACT nasal spray    Sig: Place 2 sprays into both nostrils daily.    Dispense:  16 g    Refill:  0      *This clinic note was created using [employer]. Therefore, there may be occasional mistakes despite careful proofreading.  ?    Melynda Ripple, MD 04/17/22 [id number]

## 2022-04-16 NOTE — Discharge Instructions (Addendum)
I suspect eustachian tube dysfunction from your allergies versus pain from your jaw joint.  Try an antihistamine such as Claritin, Allegra, or Zyrtec.start saline nasal irrigation with a Milta Deiters Med rinse and distilled water as often as you want, Flonase, take Naprosyn with 1000 mg Tylenol twice a day, Flexeril at night.  Soft diet for the next 4 to 5 days.  Follow-up with a dentist to get a bite guard.

## 2022-05-11 ENCOUNTER — Ambulatory Visit (INDEPENDENT_AMBULATORY_CARE_PROVIDER_SITE_OTHER): Payer: BC Managed Care – PPO | Admitting: Family

## 2022-05-11 ENCOUNTER — Encounter: Payer: Self-pay | Admitting: Family

## 2022-05-11 VITALS — BP 108/78 | HR 79 | Temp 97.8°F | Resp 16 | Ht 69.88 in | Wt 247.4 lb

## 2022-05-11 DIAGNOSIS — Z13220 Encounter for screening for lipoid disorders: Secondary | ICD-10-CM

## 2022-05-11 DIAGNOSIS — Z1159 Encounter for screening for other viral diseases: Secondary | ICD-10-CM | POA: Diagnosis not present

## 2022-05-11 DIAGNOSIS — E6609 Other obesity due to excess calories: Secondary | ICD-10-CM | POA: Insufficient documentation

## 2022-05-11 DIAGNOSIS — Z6835 Body mass index (BMI) 35.0-35.9, adult: Secondary | ICD-10-CM

## 2022-05-11 DIAGNOSIS — Z7689 Persons encountering health services in other specified circumstances: Secondary | ICD-10-CM

## 2022-05-11 DIAGNOSIS — Z113 Encounter for screening for infections with a predominantly sexual mode of transmission: Secondary | ICD-10-CM

## 2022-05-11 DIAGNOSIS — J302 Other seasonal allergic rhinitis: Secondary | ICD-10-CM | POA: Insufficient documentation

## 2022-05-11 MED ORDER — LORATADINE 10 MG PO TABS: 10.0000 mg | ORAL_TABLET | Freq: Every day | ORAL | 11 refills | Status: DC

## 2022-05-11 NOTE — Patient Instructions (Signed)

## 2022-05-11 NOTE — Progress Notes (Signed)
Provider: Marlowe Sax FNP-C   Hilario Robarts, Nelda Bucks, NP  Patient Care Team: Raford Brissett, Nelda Bucks, NP as PCP - General (Family Medicine)  Extended Emergency Contact Information Primary Emergency Contact: Cameron,Portia Address: [address], [place] [address] [place] Phone: [phone] Relation: Spouse  Code Status:  Full Code  Goals of care: Advanced Directive information    10/04/2016    9:24 AM  Advanced Directives  Does Patient Have a Medical Advance Directive? No     Chief Complaint  Patient presents with   Establish Care    New Patient to establish care.     HPI:  Pt is a 42 y.o. male seen today establish care here at [facility] for medical management of chronic diseases.He is here with the wife.  Has some medical history of Bell's palsy on the right side and seasonal allergies.  Has not had a primary care provider for several years.  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  He was recently seen in the ED on 04/13/2022 for left otalgia.  No acute abnormalities was noted.  He was advised to take antihistamine such as Claritin, Allegra, Zyrtec and Flonase.  Also advised to use naproxen or Tylenol.  Soft diet for few days.  Flexeril was prescribed to take at night due to on grinding teeth during the night.  He was also advised to establish care with a PCP and a dentist to get a bite guard.  States pain on the left ear has resolved. He denies any acute issues. He does not smoke or drink alcohol. Due for influenza and COVID 19 vaccine but declines both. State has never been screened for hep C and HIV. Not fasting today will come back for fasting blood work.  Past Medical History:  Diagnosis Date   Right-sided Bell's palsy    Tendonitis    History reviewed. No pertinent surgical history.  No Known Allergies  Allergies as of 05/11/2022   No Known Allergies       Medication List        Accurate as of May 11, 2022  8:50 PM. If you have any questions, ask your nurse or doctor.          STOP taking these medications    predniSONE 20 MG tablet Commonly known as: DELTASONE Stopped by: Sandrea Hughs, NP       TAKE these medications    cyclobenzaprine 10 MG tablet Commonly known as: FLEXERIL Take 1 tablet (10 mg total) by mouth at bedtime.   fluticasone 50 MCG/ACT nasal spray Commonly known as: FLONASE Place 2 sprays into both nostrils daily.   hydroxypropyl methylcellulose / hypromellose 2.5 % ophthalmic solution Commonly known as: ISOPTO TEARS / GONIOVISC Place 1 drop into both eyes 3 (three) times daily.   loratadine 10 MG tablet Commonly known as: CLARITIN Take 1 tablet (10 mg total) by mouth daily. Started by: Sandrea Hughs, NP   naproxen 500 MG tablet Commonly known as: NAPROSYN Take 1 tablet (500 mg total) by mouth 2 (two) times daily.   valACYclovir 1000 MG tablet Commonly known as: VALTREX Take 1 tablet (1,000 mg total) by mouth 3 (three) times daily.        Review of Systems  Constitutional:  Negative for appetite change, chills, fatigue, fever and unexpected weight change.  HENT:  Negative for congestion, dental problem, ear discharge, ear pain, facial swelling, hearing loss, nosebleeds, postnasal drip, rhinorrhea, sinus pressure, sinus pain, sneezing, sore throat, tinnitus and trouble swallowing.   Eyes:  Negative for pain, discharge, redness, itching and visual disturbance.  Respiratory:  Negative for cough, chest tightness, shortness of breath and wheezing.   Cardiovascular:  Negative for chest pain, palpitations and leg swelling.  Gastrointestinal:  Negative for abdominal distention, abdominal pain, blood in stool, constipation, diarrhea, nausea and vomiting.  Endocrine: Negative for cold intolerance, heat intolerance, polydipsia, polyphagia and polyuria.  Genitourinary:  Negative for difficulty  urinating, dysuria, flank pain, frequency and urgency.  Musculoskeletal:  Negative for arthralgias, back pain, gait problem, joint swelling, myalgias, neck pain and neck stiffness.  Skin:  Negative for color change, pallor, rash and wound.  Neurological:  Negative for dizziness, syncope, speech difficulty, weakness, light-headedness, numbness and headaches.  Hematological:  Does not bruise/bleed easily.  Psychiatric/Behavioral:  Negative for agitation, behavioral problems, confusion, hallucinations, self-injury, sleep disturbance and suicidal ideas. The patient is not nervous/anxious.     Immunization History  Administered Date(s) Administered   Tdap 08/30/2021   Pertinent  Health Maintenance Due  Topic Date Due   INFLUENZA VACCINE  05/31/2022 (Originally 09/30/2021)      03/18/2021   12:09 AM 02/04/2022    1:50 PM  Fall Risk  (RETIRED) Patient Fall Risk Level Low fall risk Low fall risk   Functional Status Survey: Does the patient have difficulty dressing or bathing?: No  Vitals:   05/11/22 1444  BP: 108/78  Pulse: 79  Resp: 16  Temp: 97.8 F (36.6 C)  TempSrc: Temporal  SpO2: 98%  Weight: 247 lb 6.4 oz (112.2 kg)  Height: 5' 9.88" (1.775 m)   Body mass index is 35.62 kg/m. Physical Exam Vitals reviewed.  Constitutional:      General: He is not in acute distress.    Appearance: Normal appearance. He is obese. He is not ill-appearing or diaphoretic.  HENT:     Head: Normocephalic.     Right Ear: Tympanic membrane, ear canal and external ear normal. There is no impacted cerumen.     Left Ear: Tympanic membrane, ear canal and external ear normal. There is no impacted cerumen.     Ears:     Comments: HOH on left ear     Nose: Nose normal. No congestion or rhinorrhea.     Mouth/Throat:     Mouth: Mucous membranes are moist.     Pharynx: Oropharynx is clear. No oropharyngeal exudate or posterior oropharyngeal erythema.  Eyes:     General: No scleral icterus.        Right eye: No discharge.        Left eye: No discharge.     Extraocular Movements: Extraocular movements intact.     Conjunctiva/sclera: Conjunctivae normal.     Pupils: Pupils are equal, round, and reactive to light.  Neck:     Vascular: No carotid bruit.  Cardiovascular:     Rate and Rhythm: Normal rate and regular rhythm.     Pulses: Normal pulses.     Heart sounds: Normal heart sounds. No murmur heard.    No friction rub. No gallop.  Pulmonary:     Effort: Pulmonary effort is normal. No respiratory distress.     Breath sounds: Normal breath sounds. No wheezing, rhonchi or rales.  Chest:     Chest wall: No tenderness.  Abdominal:     General: Bowel sounds are  normal. There is no distension.     Palpations: Abdomen is soft. There is no mass.     Tenderness: There is no abdominal tenderness. There is no right CVA tenderness, left CVA tenderness, guarding or rebound.  Musculoskeletal:        General: No swelling or tenderness. Normal range of motion.     Cervical back: Normal range of motion. No rigidity or tenderness.     Right lower leg: No edema.     Left lower leg: No edema.  Lymphadenopathy:     Cervical: No cervical adenopathy.  Skin:    General: Skin is warm and dry.     Coloration: Skin is not pale.     Findings: No bruising, erythema, lesion or rash.  Neurological:     Mental Status: He is alert and oriented to person, place, and time.     Cranial Nerves: No cranial nerve deficit.     Sensory: No sensory deficit.     Motor: No weakness.     Coordination: Coordination normal.     Gait: Gait normal.  Psychiatric:        Mood and Affect: Mood normal.        Speech: Speech normal.        Behavior: Behavior normal.        Thought Content: Thought content normal.        Judgment: Judgment normal.     Labs reviewed: No results for input(s): "NA", "K", "CL", "CO2", "GLUCOSE", "BUN", "CREATININE", "CALCIUM", "MG", "PHOS" in the last 8760 hours. No results for  input(s): "AST", "ALT", "ALKPHOS", "BILITOT", "PROT", "ALBUMIN" in the last 8760 hours. No results for input(s): "WBC", "NEUTROABS", "HGB", "HCT", "MCV", "PLT" in the last 8760 hours. No results found for: "TSH" No results found for: "HGBA1C" No results found for: "CHOL", "HDL", "LDLCALC", "LDLDIRECT", "TRIG", "CHOLHDL"  Significant Diagnostic Results in last 30 days:  No results found.  Assessment/Plan  1. Encounter to establish care Available medical records reviewed.Due for COVID-19 and influenza vaccine but declined.Recommend scheduling for fasting blood work.Also recommends scheduling for annual physical examination.  2. Screening for hyperlipidemia No previous labs for review - Lipid panel; Future  3. Screen for STD (sexually transmitted disease) Reports no high risk behaviors - HIV Antibody (routine testing w rflx); Future  4. Encounter for hepatitis C screening test for low risk patient Low risk - Hep C Antibody; Future  5. Seasonal allergies Symptoms of worsen due to springtime. Advised to take over-the-counter Claritin or cetirizine.  7 days samples of cetirizine given during this visit. - COMPLETE METABOLIC PANEL WITH GFR; Future - CBC with Differential/Platelet; Future  6. Body mass index (BMI) of 35.0-35.9 in adult BMI 35.62 Dietary modification and exercise advised  7. Class 2 obesity due to excess calories without serious comorbidity with body mass index (BMI) of 35.0 to 35.9 in adult BMI 35.62 Dietary modification and exercise at least 3 times per week for 30 minutes advised.  Family/ staff Communication: Reviewed plan of care with patient verbalized understanding.   Labs/tests ordered:  - Lipid panel; Future - COMPLETE METABOLIC PANEL WITH GFR; Future - CBC with Differential/Platelet; Future - Hep C Antibody; Future - HIV Antibody (routine testing w rflx); Future  Next Appointment : Return in about 1 year (around 05/11/2023) for annual Physical  examination.Fasting labs in 2 weeks .   Sandrea Hughs, NP

## 2022-05-25 ENCOUNTER — Other Ambulatory Visit: Payer: BC Managed Care – PPO

## 2022-05-25 DIAGNOSIS — Z113 Encounter for screening for infections with a predominantly sexual mode of transmission: Secondary | ICD-10-CM

## 2022-05-25 DIAGNOSIS — J302 Other seasonal allergic rhinitis: Secondary | ICD-10-CM

## 2022-05-25 DIAGNOSIS — Z1159 Encounter for screening for other viral diseases: Secondary | ICD-10-CM

## 2022-05-25 DIAGNOSIS — Z13220 Encounter for screening for lipoid disorders: Secondary | ICD-10-CM

## 2022-05-25 LAB — CBC WITH DIFFERENTIAL/PLATELET
Absolute Monocytes: 533 cells/uL (ref 200–950)
HCT: 42.8 % (ref 38.5–50.0)
MCV: 90.7 fL (ref 80.0–100.0)
Monocytes Relative: 7.4 %
RBC: 4.72 10*6/uL (ref 4.20–5.80)

## 2022-05-26 LAB — COMPLETE METABOLIC PANEL WITH GFR
AST: 25 U/L (ref 10–40)
Alkaline phosphatase (APISO): 64 U/L (ref 36–130)
CO2: 28 mmol/L (ref 20–32)
Creat: 1.15 mg/dL (ref 0.60–1.29)
Glucose, Bld: 109 mg/dL — ABNORMAL HIGH (ref 65–99)
Sodium: 142 mmol/L (ref 135–146)
Total Protein: 7.3 g/dL (ref 6.1–8.1)
eGFR: 83 mL/min/{1.73_m2} (ref >=60)

## 2022-05-26 LAB — LIPID PANEL
Cholesterol: 186 mg/dL (ref <200)
Non-HDL Cholesterol (Calc): 152 mg/dL (calc) — ABNORMAL HIGH (ref <130)
Triglycerides: 290 mg/dL — ABNORMAL HIGH (ref <150)

## 2022-05-26 LAB — CBC WITH DIFFERENTIAL/PLATELET
Neutrophils Relative %: 47.4 %
RDW: 13.1 % (ref 11.0–15.0)
WBC: 7.2 10*3/uL (ref 3.8–10.8)

## 2022-05-28 LAB — TEST AUTHORIZATION

## 2022-05-28 LAB — COMPLETE METABOLIC PANEL WITH GFR
AG Ratio: 1.4 (calc) (ref 1.0–2.5)
Albumin: 4.2 g/dL (ref 3.6–5.1)
BUN: 12 mg/dL (ref 7–25)
Chloride: 108 mmol/L (ref 98–110)
Globulin: 3.1 g/dL (calc) (ref 1.9–3.7)
Potassium: 4.2 mmol/L (ref 3.5–5.3)

## 2022-05-28 LAB — CBC WITH DIFFERENTIAL/PLATELET
Eosinophils Absolute: 238 cells/uL (ref 15–500)
MCHC: 33.6 g/dL (ref 32.0–36.0)
Platelets: 228 10*3/uL (ref 140–400)

## 2022-05-28 LAB — LIPID PANEL: Total CHOL/HDL Ratio: 5.5 (calc) — ABNORMAL HIGH (ref <5.0)

## 2022-05-28 LAB — HEPATITIS C ANTIBODY: Hepatitis C Ab: NONREACTIVE

## 2022-05-28 LAB — HIV ANTIBODY (ROUTINE TESTING W REFLEX): HIV 1&2 Ab, 4th Generation: NONREACTIVE

## 2022-05-29 LAB — HEMOGLOBIN A1C W/OUT EAG: Hgb A1c MFr Bld: 5.8 % of total Hgb — ABNORMAL HIGH (ref <5.7)

## 2022-05-29 LAB — CBC WITH DIFFERENTIAL/PLATELET
Basophils Absolute: 22 cells/uL (ref 0–200)
Basophils Relative: 0.3 %
Eosinophils Relative: 3.3 %
Hemoglobin: 14.4 g/dL (ref 13.2–17.1)
Lymphs Abs: 2995 cells/uL (ref 850–3900)
MCH: 30.5 pg (ref 27.0–33.0)
MPV: 11.2 fL (ref 7.5–12.5)
Neutro Abs: 3413 cells/uL (ref 1500–7800)
Total Lymphocyte: 41.6 %

## 2022-05-29 LAB — TEST AUTHORIZATION

## 2022-05-29 LAB — LIPID PANEL
HDL: 34 mg/dL — ABNORMAL LOW (ref >=40)
LDL Cholesterol (Calc): 111 mg/dL (calc) — ABNORMAL HIGH

## 2022-05-29 LAB — COMPLETE METABOLIC PANEL WITH GFR
ALT: 34 U/L (ref 9–46)
Calcium: 9.2 mg/dL (ref 8.6–10.3)
Total Bilirubin: 0.6 mg/dL (ref 0.2–1.2)

## 2022-09-04 ENCOUNTER — Ambulatory Visit (INDEPENDENT_AMBULATORY_CARE_PROVIDER_SITE_OTHER): Payer: BC Managed Care – PPO | Admitting: Family

## 2022-09-04 ENCOUNTER — Encounter: Payer: Self-pay | Admitting: Family

## 2022-09-04 VITALS — BP 122/78 | HR 85 | Temp 98.0°F | Ht 69.0 in | Wt 251.8 lb

## 2022-09-04 DIAGNOSIS — E785 Hyperlipidemia, unspecified: Secondary | ICD-10-CM | POA: Diagnosis not present

## 2022-09-04 DIAGNOSIS — R7303 Prediabetes: Secondary | ICD-10-CM

## 2022-09-04 NOTE — Progress Notes (Unsigned)
Provider: Richarda Blade Harrison   Raymond Harrison, Raymond Citrin, NP  Patient Care Team: Raymond Harrison, Raymond Citrin, NP as PCP - General (Family Medicine)  Extended Emergency Contact Information Primary Emergency Contact: Raymond Harrison Address: [address] [place], [place] [address] [place] Home Phone: [phone] Relation: Spouse  Code Status:  Full Code  Goals of care: Advanced Directive information    10/04/2016    9:24 AM  Advanced Directives  Does Patient Have a Medical Advance Directive? No     Chief Complaint  Patient presents with   Medical Management of Chronic Issues    Patient presents today for a 4 month follow-up    HPI:  Pt is a 42 y.o. male seen today for 4 months for medical management of chronic diseases.   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  Hyperlipidemia - eats late at night and goes to sleep.HDL 34,TRG 290 tends to eat pork chops and marsh potatoes.Has been using air fry now.    Prediabetes - previous A1 C 5.8   Seasonal allergies - claritin or allergan has been effective.allergic to grass which has worsen since everyone is cutting grass.   Past Medical History:  Diagnosis Date   Right-sided Bell's palsy    Tendonitis    History reviewed. No pertinent surgical history.  No Known Allergies  Allergies as of 09/04/2022   No Known Allergies      Medication List        Accurate as of September 04, 2022  9:43 AM. If you have any questions, ask your nurse or doctor.          STOP taking these medications    cyclobenzaprine 10 MG tablet Commonly known as: FLEXERIL Stopped by: Raymond Bookman, NP   fluticasone 50 MCG/ACT nasal spray Commonly known as: FLONASE Stopped by: Raymond Bookman, NP   naproxen 500 MG tablet Commonly known as: NAPROSYN Stopped by: Raymond Bookman, NP   valACYclovir 1000 MG tablet Commonly  known as: VALTREX Stopped by: Raymond Bookman, NP       TAKE these medications    hydroxypropyl methylcellulose / hypromellose 2.5 % ophthalmic solution Commonly known as: ISOPTO TEARS / GONIOVISC Place 1 drop into both eyes 3 (three) times daily.   loratadine 10 MG tablet Commonly known as: CLARITIN Take 1 tablet (10 mg total) by mouth daily.        Review of Systems  Constitutional:  Negative for appetite change, chills, fatigue, fever and unexpected weight change.  HENT:  Negative for congestion, dental problem, ear discharge, ear pain, facial swelling, hearing loss, nosebleeds, postnasal drip, rhinorrhea, sinus pressure, sinus pain, sneezing, sore throat, tinnitus and trouble swallowing.   Eyes:  Negative for pain, discharge, redness, itching and visual disturbance.  Respiratory:  Negative for cough, chest tightness, shortness of breath and wheezing.   Cardiovascular:  Negative for chest pain, palpitations and leg swelling.  Gastrointestinal:  Negative for abdominal distention, abdominal pain, blood in stool, constipation, diarrhea, nausea and vomiting.  Endocrine: Negative for cold intolerance, heat intolerance, polydipsia, polyphagia and polyuria.  Genitourinary:  Negative for difficulty urinating, dysuria, flank pain, frequency and urgency.  Musculoskeletal:  Negative for arthralgias, back pain, gait problem, joint swelling, myalgias, neck pain and neck stiffness.  Skin:  Negative for color change, pallor, rash and wound.  Neurological:  Negative for dizziness, syncope, speech difficulty, weakness, light-headedness, numbness and headaches.  Hematological:  Does not bruise/bleed easily.  Psychiatric/Behavioral:  Negative for agitation, behavioral problems, confusion, hallucinations, self-injury, sleep disturbance and suicidal ideas. The patient is not nervous/anxious.     Immunization History  Administered Date(s) Administered   Tdap 08/30/2021   Pertinent  Health  Maintenance Due  Topic Date Due   INFLUENZA VACCINE  10/01/2022      03/18/2021   12:09 AM 02/04/2022    1:50 PM 09/04/2022    9:15 AM  Fall Risk  Falls in the past year?   0  Was there an injury with Fall?   0  Fall Risk Category Calculator   0  (RETIRED) Patient Fall Risk Level Low fall risk Low fall risk   Patient at Risk for Falls Due to   No Fall Risks  Fall risk Follow up   Falls evaluation completed   Functional Status Survey:    Vitals:   09/04/22 0915  BP: 122/78  Pulse: 85  Temp: 98 F (36.7 C)  SpO2: 97%  Weight: 251 lb 12.8 oz (114.2 kg)  Height: 5\' 9"  (1.753 m)   Body mass index is 37.18 kg/m. Physical Exam Vitals reviewed.  Constitutional:      General: He is not in acute distress.    Appearance: Normal appearance. He is normal weight. He is not ill-appearing or diaphoretic.  HENT:     Head: Normocephalic.     Right Ear: Tympanic membrane, ear canal and external ear normal. There is no impacted cerumen.     Left Ear: Tympanic membrane, ear canal and external ear normal. There is no impacted cerumen.     Nose: Nose normal. No congestion or rhinorrhea.     Mouth/Throat:     Mouth: Mucous membranes are moist.     Pharynx: Oropharynx is clear. No oropharyngeal exudate or posterior oropharyngeal erythema.  Eyes:     General: No scleral icterus.       Right eye: No discharge.        Left eye: No discharge.     Extraocular Movements: Extraocular movements intact.     Conjunctiva/sclera: Conjunctivae normal.     Pupils: Pupils are equal, round, and reactive to light.  Neck:     Vascular: No carotid bruit.  Cardiovascular:     Rate and Rhythm: Normal rate and regular rhythm.     Pulses: Normal pulses.     Heart sounds: Normal heart sounds. No murmur heard.    No friction rub. No gallop.  Pulmonary:     Effort: Pulmonary effort is normal. No respiratory distress.     Breath sounds: Normal breath sounds. No wheezing, rhonchi or rales.  Chest:     Chest  wall: No tenderness.  Abdominal:     General: Bowel sounds are normal. There is no distension.     Palpations: Abdomen is soft. There is no mass.     Tenderness: There is no abdominal tenderness. There is no right CVA tenderness, left CVA tenderness, guarding or rebound.  Musculoskeletal:        General: No swelling or tenderness. Normal range of motion.     Cervical back: Normal range of motion. No rigidity or tenderness.     Right lower leg: No edema.     Left lower leg: No edema.  Lymphadenopathy:     Cervical: No cervical adenopathy.  Skin:    General: Skin is warm and  dry.     Coloration: Skin is not pale.     Findings: No bruising, erythema, lesion or rash.  Neurological:     Mental Status: He is alert and oriented to person, place, and time.     Cranial Nerves: No cranial nerve deficit.     Sensory: No sensory deficit.     Motor: No weakness.     Coordination: Coordination normal.     Gait: Gait normal.  Psychiatric:        Mood and Affect: Mood normal.        Speech: Speech normal.        Behavior: Behavior normal.        Thought Content: Thought content normal.        Judgment: Judgment normal.     Labs reviewed: Recent Labs    05/25/22 0926  NA 142  K 4.2  CL 108  CO2 28  GLUCOSE 109*  BUN 12  CREATININE 1.15  CALCIUM 9.2   Recent Labs    05/25/22 0926  AST 25  ALT 34  BILITOT 0.6  PROT 7.3   Recent Labs    05/25/22 0926  WBC 7.2  NEUTROABS 3,413  HGB 14.4  HCT 42.8  MCV 90.7  PLT 228   No results found for: "TSH" Lab Results  Component Value Date   HGBA1C 5.8 (H) 05/25/2022   Lab Results  Component Value Date   CHOL 186 05/25/2022   HDL 34 (L) 05/25/2022   LDLCALC 111 (H) 05/25/2022   TRIG 290 (H) 05/25/2022   CHOLHDL 5.5 (H) 05/25/2022    Significant Diagnostic Results in last 30 days:  No results found.  Assessment/Plan There are no diagnoses linked to this encounter.   Family/ staff Communication: Reviewed plan of care  with patient  Labs/tests ordered: None   Next Appointment : Return if symptoms worsen or fail to improve.    Raymond Bookman, NP

## 2022-09-05 LAB — HEMOGLOBIN A1C
Hgb A1c MFr Bld: 6.3 % of total Hgb — ABNORMAL HIGH (ref <5.7)
Mean Plasma Glucose: 134 mg/dL
eAG (mmol/L): 7.4 mmol/L

## 2022-09-05 LAB — LIPID PANEL
Cholesterol: 209 mg/dL — ABNORMAL HIGH
HDL: 39 mg/dL — ABNORMAL LOW
Non-HDL Cholesterol (Calc): 170 mg/dL — ABNORMAL HIGH
Total CHOL/HDL Ratio: 5.4 (calc) — ABNORMAL HIGH
Triglycerides: 428 mg/dL — ABNORMAL HIGH

## 2022-09-08 ENCOUNTER — Other Ambulatory Visit: Payer: Self-pay | Admitting: Family

## 2022-09-08 ENCOUNTER — Other Ambulatory Visit: Payer: Self-pay

## 2022-09-08 DIAGNOSIS — E781 Pure hyperglyceridemia: Secondary | ICD-10-CM

## 2022-09-08 DIAGNOSIS — R7303 Prediabetes: Secondary | ICD-10-CM

## 2022-09-08 MED ORDER — ATORVASTATIN CALCIUM 10 MG PO TABS: 10.0000 mg | ORAL_TABLET | Freq: Every day | ORAL | 0 refills | Status: DC

## 2022-09-08 NOTE — Progress Notes (Signed)
Nutritionist referral ordered for hypertriglyceridemia.

## 2022-11-25 ENCOUNTER — Ambulatory Visit: Payer: BC Managed Care – PPO | Admitting: Dietician

## 2023-01-19 ENCOUNTER — Other Ambulatory Visit: Payer: Self-pay

## 2023-01-19 MED ORDER — ATORVASTATIN CALCIUM 10 MG PO TABS: 10.0000 mg | ORAL_TABLET | Freq: Every day | ORAL | 1 refills | Status: DC

## 2023-05-14 ENCOUNTER — Ambulatory Visit (INDEPENDENT_AMBULATORY_CARE_PROVIDER_SITE_OTHER): Payer: BC Managed Care – PPO | Admitting: Family

## 2023-05-14 ENCOUNTER — Encounter: Payer: Self-pay | Admitting: Family

## 2023-05-14 VITALS — BP 126/74 | HR 96 | Temp 98.0°F | Resp 20 | Ht 69.0 in | Wt 249.0 lb

## 2023-05-14 DIAGNOSIS — R7303 Prediabetes: Secondary | ICD-10-CM

## 2023-05-14 DIAGNOSIS — Z0000 Encounter for general adult medical examination without abnormal findings: Secondary | ICD-10-CM | POA: Diagnosis not present

## 2023-05-14 DIAGNOSIS — E785 Hyperlipidemia, unspecified: Secondary | ICD-10-CM

## 2023-05-14 DIAGNOSIS — J302 Other seasonal allergic rhinitis: Secondary | ICD-10-CM | POA: Diagnosis not present

## 2023-05-14 MED ORDER — LORATADINE 10 MG PO TABS: 10.0000 mg | ORAL_TABLET | Freq: Every day | ORAL | 11 refills | Status: DC

## 2023-05-14 MED ORDER — FLUTICASONE PROPIONATE 50 MCG/ACT NA SUSP: 2.0000 | Freq: Every day | NASAL | 6 refills | Status: AC

## 2023-05-15 LAB — COMPLETE METABOLIC PANEL WITH GFR
AG Ratio: 1.3 (calc) (ref 1.0–2.5)
ALT: 34 U/L (ref 9–46)
AST: 29 U/L (ref 10–40)
Albumin: 4.5 g/dL (ref 3.6–5.1)
Alkaline phosphatase (APISO): 72 U/L (ref 36–130)
BUN: 13 mg/dL (ref 7–25)
CO2: 28 mmol/L (ref 20–32)
Calcium: 9.5 mg/dL (ref 8.6–10.3)
Chloride: 107 mmol/L (ref 98–110)
Creat: 1.27 mg/dL (ref 0.60–1.29)
Globulin: 3.5 g/dL (ref 1.9–3.7)
Glucose, Bld: 87 mg/dL (ref 65–139)
Potassium: 4.5 mmol/L (ref 3.5–5.3)
Sodium: 143 mmol/L (ref 135–146)
Total Bilirubin: 0.8 mg/dL (ref 0.2–1.2)
Total Protein: 8 g/dL (ref 6.1–8.1)
eGFR: 73 mL/min/{1.73_m2} (ref >=60)

## 2023-05-15 LAB — CBC WITH DIFFERENTIAL/PLATELET
Absolute Lymphocytes: 3159 {cells}/uL (ref 850–3900)
Absolute Monocytes: 639 {cells}/uL (ref 200–950)
Basophils Absolute: 18 {cells}/uL (ref 0–200)
Basophils Relative: 0.2 %
Eosinophils Absolute: 252 {cells}/uL (ref 15–500)
Eosinophils Relative: 2.8 %
HCT: 40.3 % (ref 38.5–50.0)
Hemoglobin: 13.8 g/dL (ref 13.2–17.1)
MCH: 30.5 pg (ref 27.0–33.0)
MCHC: 34.2 g/dL (ref 32.0–36.0)
MCV: 89.2 fL (ref 80.0–100.0)
MPV: 11.3 fL (ref 7.5–12.5)
Monocytes Relative: 7.1 %
Neutro Abs: 4932 {cells}/uL (ref 1500–7800)
Neutrophils Relative %: 54.8 %
Platelets: 245 10*3/uL (ref 140–400)
RBC: 4.52 10*6/uL (ref 4.20–5.80)
RDW: 12.9 % (ref 11.0–15.0)
Total Lymphocyte: 35.1 %
WBC: 9 10*3/uL (ref 3.8–10.8)

## 2023-05-15 LAB — LIPID PANEL
Cholesterol: 145 mg/dL (ref <200)
HDL: 29 mg/dL — ABNORMAL LOW (ref >=40)
LDL Cholesterol (Calc): 78 mg/dL
Non-HDL Cholesterol (Calc): 116 mg/dL (ref <130)
Total CHOL/HDL Ratio: 5 (calc) — ABNORMAL HIGH (ref <5.0)
Triglycerides: 291 mg/dL — ABNORMAL HIGH (ref <150)

## 2023-05-15 LAB — HEMOGLOBIN A1C
Hgb A1c MFr Bld: 6.2 %{Hb} — ABNORMAL HIGH (ref <5.7)
Mean Plasma Glucose: 131 mg/dL
eAG (mmol/L): 7.3 mmol/L

## 2023-05-15 LAB — TSH: TSH: 0.87 m[IU]/L (ref 0.40–4.50)

## 2023-05-23 DIAGNOSIS — R7303 Prediabetes: Secondary | ICD-10-CM | POA: Insufficient documentation

## 2023-05-23 DIAGNOSIS — E785 Hyperlipidemia, unspecified: Secondary | ICD-10-CM | POA: Insufficient documentation

## 2023-05-23 NOTE — Progress Notes (Signed)
 Provider: Richarda Blade FNP-C   Morey Andonian, [name], NP  Patient Care Team: Janaia Kozel, [name], NP as PCP - General (Family Medicine)  Extended Emergency Contact Information Primary Emergency Contact: Cameron,Portia Address: [address] [place], [place] [address] [place] Home Phone: [phone] Relation: Spouse  Code Status:  Full code  Goals of care: Advanced Directive information    05/14/2023    2:49 PM  Advanced Directives  Does Patient Have a Medical Advance Directive? No  Would patient like information on creating a medical advance directive? No - Patient declined     Chief Complaint  Patient presents with   Annual Exam    1 year follow up and discuss covid,flu,and pneumococcal vaccine.       Discussed the use of AI scribe software for clinical note transcription with the patient, who gave verbal consent to proceed.  History of Present Illness   Raymond Harrison is a 43 year old male who presents for an annual physical exam and management of allergies.  He experiences runny nose, congestion, and frequent sneezing, particularly exacerbated by cold weather. He has a known grass allergy and mentions that his symptoms worsen when transitioning from warm to cold environments, such as moving from a heated school to the cold outdoors. He has run out of his allergy medication and requests a refill.  He has a history of elevated A1c levels and has made dietary changes over the past few months, including eliminating fast foods and sugars, eating grilled chicken, and cutting out sodas in favor of 100% pineapple juice and water. He reports a weight decrease from 252 pounds to 240 pounds, although it has fluctuated slightly. He engages in 30 minutes of walking, which has improved his breathing and overall well-being.  His previous blood work showed elevated cholesterol levels, with a total cholesterol of 209 mg/dL and triglycerides at 536 mg/dL. He has been  cutting out starchy and sugary foods to address this. His HDL was low, and he hopes exercise will improve it. His LDL was previously 111 mg/dL, but recent triglyceride levels prevented calculation.  He has a family history of prostate issues, with his father having had prostate problems. He underwent prostate screening at age 89 due to this history and plans to continue regular screenings.  No changes in vision or hearing, normal bowel movements, and no urinary symptoms. He drinks a lot of water and has used apple cider vinegar for bowel health.   Past Medical History:  Diagnosis Date   Right-sided Bell's palsy    Tendonitis    History reviewed. No pertinent surgical history.  No Known Allergies  Allergies as of 05/14/2023   No Known Allergies      Medication List        Accurate as of May 14, 2023 11:59 PM. If you have any questions, ask your nurse or doctor.          atorvastatin 10 MG tablet Commonly known as: LIPITOR Take 1 tablet (10 mg total) by mouth daily.   fluticasone 50 MCG/ACT nasal spray Commonly known as: FLONASE Place 2 sprays into both nostrils daily. Started by: [name] Jonah Gingras   hydroxypropyl methylcellulose / hypromellose 2.5 % ophthalmic solution Commonly known as: ISOPTO TEARS / GONIOVISC Place 1 drop into both eyes 3 (three) times daily.   loratadine 10 MG tablet Commonly known as: CLARITIN Take 1 tablet (10 mg total) by mouth  daily.        Review of Systems  Constitutional:  Negative for appetite change, chills, fatigue, fever and unexpected weight change.  HENT:  Positive for congestion, rhinorrhea and sneezing. Negative for ear discharge, ear pain, facial swelling, hearing loss, nosebleeds, postnasal drip, sinus pressure, sinus pain, sore throat, tinnitus and trouble swallowing.   Eyes:  Negative for pain, discharge, redness, itching and visual disturbance.  Respiratory:  Negative for cough, chest tightness, shortness of breath and  wheezing.   Cardiovascular:  Negative for chest pain, palpitations and leg swelling.  Gastrointestinal:  Negative for abdominal distention, abdominal pain, blood in stool, constipation, diarrhea, nausea and vomiting.  Endocrine: Negative for cold intolerance, heat intolerance, polydipsia, polyphagia and polyuria.  Genitourinary:  Negative for difficulty urinating, dysuria, flank pain, frequency and urgency.  Musculoskeletal:  Negative for arthralgias, back pain, gait problem, joint swelling, myalgias, neck pain and neck stiffness.  Skin:  Negative for color change, pallor, rash and wound.  Neurological:  Negative for dizziness, syncope, speech difficulty, weakness, light-headedness, numbness and headaches.  Hematological:  Does not bruise/bleed easily.  Psychiatric/Behavioral:  Negative for agitation, behavioral problems, confusion, hallucinations, self-injury, sleep disturbance and suicidal ideas. The patient is not nervous/anxious.     Immunization History  Administered Date(s) Administered   Tdap 08/30/2021   Pertinent  Health Maintenance Due  Topic Date Due   INFLUENZA VACCINE  01/14/2024 (Originally 10/01/2022)      03/18/2021   12:09 AM 02/04/2022    1:50 PM 09/04/2022    9:15 AM  Fall Risk  Falls in the past year?   0  Was there an injury with Fall?   0  Fall Risk Category Calculator   0  (RETIRED) Patient Fall Risk Level Low fall risk Low fall risk   Patient at Risk for Falls Due to   No Fall Risks  Fall risk Follow up   Falls evaluation completed   Functional Status Survey:    Vitals:   05/14/23 1450  BP: 126/74  Pulse: 96  Resp: 20  Temp: 98 F (36.7 C)  SpO2: 95%  Weight: 249 lb (112.9 kg)  Height: 5\' 9"  (1.753 m)   Body mass index is 36.77 kg/m. Physical Exam  MEASUREMENTS: Weight- 249 lbs. GENERAL: Alert, cooperative, well developed, no acute distress HEENT: Normocephalic, normal oropharynx, moist mucous membranes, tympanic membranes normal bilaterally,  nasal passages with drainage, no congestion, no sinus tenderness NECK: No lymphadenopathy CHEST: Clear to auscultation bilaterally, no wheezes, rhonchi, or crackles CARDIOVASCULAR: Normal heart rate and rhythm, S1 and S2 normal without murmurs ABDOMEN: Soft, non-tender, non-distended, without organomegaly, normal bowel sounds EXTREMITIES: No cyanosis or edema MUSCULOSKELETAL: Normal range of motion in legs and shoulders NEUROLOGICAL: Cranial nerves grossly intact, moves all extremities without gross motor or sensory deficit PSYCHIATRY/BEHAVIORAL: Mood stable   Labs reviewed: Recent Labs    05/25/22 0926 05/14/23 1524  NA 142 143  K 4.2 4.5  CL 108 107  CO2 28 28  GLUCOSE 109* 87  BUN 12 13  CREATININE 1.15 1.27  CALCIUM 9.2 9.5   Recent Labs    05/25/22 0926 05/14/23 1524  AST 25 29  ALT 34 34  BILITOT 0.6 0.8  PROT 7.3 8.0   Recent Labs    05/25/22 0926 05/14/23 1524  WBC 7.2 9.0  NEUTROABS 3,413 4,932  HGB 14.4 13.8  HCT 42.8 40.3  MCV 90.7 89.2  PLT 228 245   Lab Results  Component Value Date   TSH  0.87 05/14/2023   Lab Results  Component Value Date   HGBA1C 6.2 (H) 05/14/2023   Lab Results  Component Value Date   CHOL 145 05/14/2023   HDL 29 (L) 05/14/2023   LDLCALC 78 05/14/2023   TRIG 291 (H) 05/14/2023   CHOLHDL 5.0 (H) 05/14/2023    Significant Diagnostic Results in last 30 days:  No results found.  Assessment/Plan  Allergic Rhinitis Experiencing symptoms of allergic rhinitis, including rhinorrhea, nasal congestion, and sneezing, likely exacerbated by environmental changes such as transitioning from heated indoor environments to cold outdoors. Known grass allergy with increased symptoms in cold weather. No sinus tenderness or lymphadenopathy, indicating symptoms are likely due to allergies rather than an infection. - Prescribe nasal spray, two sprays in each nostril once daily - Prescribe loratadine, to be taken in the morning  Type 2  Diabetes Mellitus Previous elevated A1c levels prompted dietary and lifestyle changes. Adhering to a low-sugar, low-carbohydrate diet and increased physical activity, resulting in weight loss. He is motivated to continue these changes to manage diabetes. Monitoring blood glucose levels is essential to assess the effectiveness of lifestyle modifications. - Order lab work to check A1c levels  Hyperlipidemia Previous lab results showed elevated total cholesterol and triglycerides, with low HDL levels. Implemented dietary changes to reduce starchy and sugary foods and increased exercise, expected to improve lipid levels. He is aware of the need to monitor and manage cholesterol levels to reduce cardiovascular risk. Rechecking lipid profile will help evaluate the impact of lifestyle changes. - Order lab work to recheck lipid profile, including total cholesterol, triglycerides, HDL, and LDL  General Health Maintenance Up to date on Tdap vaccine, next dose due in 2033. Has not received flu or pneumococcal vaccines. Aware of the importance of monitoring prostate health due to a family history of prostate cancer and has undergone screening at age 89. Will continue to monitor for symptoms and follow up as needed. - Discuss the importance of influenza and pneumococcal vaccines - Plan to start prostate screening at age 23, but monitor for symptoms due to family history  Follow-up Scheduled for routine follow-up and lab work to monitor diabetes and cholesterol levels. He is aware of the importance of these follow-ups in managing his health conditions. - Schedule one-year follow-up appointment for medical physical - Perform lab work for diabetes and cholesterol monitoring - Call with lab results   Family/ staff Communication: Reviewed plan of care with patient verbalized understanding   Labs/tests ordered:  - CBC with Differential/Platelet - CMP with eGFR([facility]) - TSH - Hgb A1C - Lipid panel  Next  Appointment : Return in about 6 months (around 11/14/2023) for medical mangement of chronic issues.Marland Kitchen   Spent 30 minutes of Face to face and non-face to face with patient  >50% time spent counseling; reviewing medical record; tests; labs; documentation and developing future plan of care.   Caesar Bookman, NP

## 2023-05-31 ENCOUNTER — Other Ambulatory Visit: Payer: Self-pay | Admitting: Family

## 2023-05-31 DIAGNOSIS — E781 Pure hyperglyceridemia: Secondary | ICD-10-CM

## 2023-05-31 MED ORDER — FENOFIBRATE 48 MG PO TABS: 48.0000 mg | ORAL_TABLET | Freq: Every day | ORAL | 11 refills | Status: DC

## 2023-05-31 NOTE — Progress Notes (Signed)
 Noted.

## 2023-05-31 NOTE — Progress Notes (Signed)
 Fenofibrate prescription send to pharmacy.

## 2023-05-31 NOTE — Telephone Encounter (Signed)
Message routed to PCP Ngetich, Dinah C, NP  

## 2023-06-15 NOTE — Telephone Encounter (Signed)
 Message routed to PCP Ngetich, Elijio Guadel[place], NP. I see TDAP from 2023.

## 2023-11-08 ENCOUNTER — Ambulatory Visit (INDEPENDENT_AMBULATORY_CARE_PROVIDER_SITE_OTHER): Admitting: Family

## 2023-11-08 ENCOUNTER — Encounter: Payer: Self-pay | Admitting: Family

## 2023-11-08 VITALS — BP 134/82 | HR 79 | Temp 97.9°F | Resp 19 | Ht 69.0 in | Wt 254.8 lb

## 2023-11-08 DIAGNOSIS — J302 Other seasonal allergic rhinitis: Secondary | ICD-10-CM | POA: Diagnosis not present

## 2023-11-08 DIAGNOSIS — M545 Low back pain, unspecified: Secondary | ICD-10-CM | POA: Diagnosis not present

## 2023-11-08 DIAGNOSIS — E781 Pure hyperglyceridemia: Secondary | ICD-10-CM | POA: Diagnosis not present

## 2023-11-08 MED ORDER — ATORVASTATIN CALCIUM 10 MG PO TABS: 10.0000 mg | ORAL_TABLET | Freq: Every day | ORAL | 1 refills | Status: AC

## 2023-11-08 MED ORDER — HYPROMELLOSE (GONIOSCOPIC) 2.5 % OP SOLN: 1.0000 [drp] | Freq: Three times a day (TID) | OPHTHALMIC | 12 refills | Status: AC

## 2023-11-08 MED ORDER — FENOFIBRATE 48 MG PO TABS
48.0000 mg | ORAL_TABLET | Freq: Every day | ORAL | 11 refills | Status: AC
Start: 2023-11-08 — End: 2024-11-07

## 2023-11-08 MED ORDER — LORATADINE 10 MG PO TABS: 10.0000 mg | ORAL_TABLET | Freq: Every day | ORAL | 3 refills | Status: AC

## 2023-11-08 MED ORDER — PREDNISONE 20 MG PO TABS: 40.0000 mg | ORAL_TABLET | Freq: Every day | ORAL | 0 refills | Status: AC

## 2023-11-08 NOTE — Patient Instructions (Signed)
-   Please get Lower back X-ray at [facility] imaging at [facility] then will call you with results.

## 2023-11-08 NOTE — Progress Notes (Signed)
 Provider: Roxan Plough FNP-C  Micaiah Remillard, Roxan BROCKS, NP  Patient Care Team: Shalamar Plourde, Roxan BROCKS, NP as PCP - General (Family Medicine)  Extended Emergency Contact Information Primary Emergency Contact: Cameron,Portia Address: [address] [place], [place] [address] [place] Home Phone: [phone] Relation: Spouse  Code Status: Full Code  Goals of care: Advanced Directive information    11/08/2023    9:34 AM  Advanced Directives  Does Patient Have a Medical Advance Directive? No  Would patient like information on creating a medical advance directive? No - Patient declined     Chief Complaint  Patient presents with   Back Pain    Lower back pain.    Discussed the use of AI scribe software for clinical note transcription with the patient, who gave verbal consent to proceed.  History of Present Illness   Raymond Harrison is a 43 year old male who presents with lower back pain.  He has been experiencing lower back pain that began intermittently about a week ago, becoming more consistent since Saturday. The pain is located in the middle of the lower back and is exacerbated by lying down, with a severity of 6 out of 10. 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  Two months ago, he experienced right leg pain after twisting it during a stretch, which required crutches. The pain was severe, located in the calf muscle, and did not extend to the foot. He was evaluated at urgent care, where he was told that nothing was broken or warm to suggest a blood clot, and was advised that he likely pulled some muscles and would have pain off and on for a while. He was prescribed ibuprofen 800 mg and muscle relaxers, which he continues to use as needed.  His current medications include atorvastatin  for cholesterol, loratadine  10 mg for allergies, Flonase  nasal spray, and fenofibrate  for high  triglycerides. He notes a recent weight fluctuation and is trying to maintain a weight under 245 pounds by eating healthier, including chicken breast and vegetables, and avoiding greasy foods and starches.  He urinates three times during the night, which he considers normal, and denies any other urinary symptoms. He acknowledges the need to increase his water intake, currently drinking two to three bottles a day. No numbness, tingling, or pain radiating down the legs.   Past Medical History:  Diagnosis Date   Right-sided Bell's palsy    Tendonitis    History reviewed. No pertinent surgical history.  No Known Allergies  Outpatient Encounter Medications as of 11/08/2023  Medication Sig   fluticasone  (FLONASE ) 50 MCG/ACT nasal spray Place 2 sprays into both nostrils daily.   predniSONE  (DELTASONE ) 20 MG tablet Take 2 tablets (40 mg total) by mouth daily with breakfast for 5 days.   [DISCONTINUED] atorvastatin  (LIPITOR) 10 MG tablet Take 1 tablet (10 mg total) by mouth daily.   [DISCONTINUED] loratadine  (CLARITIN ) 10 MG tablet Take 1 tablet (10 mg total) by mouth daily.   atorvastatin  (LIPITOR) 10 MG tablet Take 1 tablet (10 mg total) by mouth daily.   fenofibrate  (TRICOR ) 48 MG tablet Take 1 tablet (48 mg total) by mouth daily.   hydroxypropyl methylcellulose / hypromellose (ISOPTO TEARS / GONIOVISC) 2.5 % ophthalmic solution Place 1 drop into both eyes 3 (three) times daily.   loratadine  (CLARITIN ) 10 MG tablet Take 1 tablet (10  mg total) by mouth daily.   [DISCONTINUED] fenofibrate  (TRICOR ) 48 MG tablet Take 1 tablet (48 mg total) by mouth daily. (Patient not taking: Reported on 11/08/2023)   [DISCONTINUED] hydroxypropyl methylcellulose / hypromellose (ISOPTO TEARS / GONIOVISC) 2.5 % ophthalmic solution Place 1 drop into both eyes 3 (three) times daily. (Patient not taking: Reported on 11/08/2023)   No facility-administered encounter medications on file as of 11/08/2023.    Review of Systems   Constitutional:  Negative for appetite change, chills, fatigue, fever and unexpected weight change.  HENT:  Negative for congestion, ear discharge, ear pain, hearing loss, nosebleeds, postnasal drip, rhinorrhea, sinus pressure, sinus pain, sneezing, sore throat and tinnitus.   Eyes:  Negative for pain, discharge, redness, itching and visual disturbance.  Respiratory:  Negative for cough, chest tightness, shortness of breath and wheezing.   Cardiovascular:  Negative for chest pain, palpitations and leg swelling.  Gastrointestinal:  Negative for abdominal distention, abdominal pain and constipation.  Genitourinary:  Negative for difficulty urinating, dysuria, flank pain, frequency and urgency.  Musculoskeletal:  Positive for back pain. Negative for arthralgias, gait problem, joint swelling, myalgias, neck pain and neck stiffness.  Skin:  Negative for color change, pallor, rash and wound.  Neurological:  Negative for dizziness, weakness, light-headedness, numbness and headaches.    Immunization History  Administered Date(s) Administered   Tdap 08/30/2021   Pertinent  Health Maintenance Due  Topic Date Due   Influenza Vaccine  05/30/2024 (Originally 10/01/2023)      03/18/2021   12:09 AM 02/04/2022    1:50 PM 09/04/2022    9:15 AM 11/08/2023    9:34 AM  Fall Risk  Falls in the past year?   0 0  Was there an injury with Fall?   0 0  Fall Risk Category Calculator   0 0  (RETIRED) Patient Fall Risk Level Low fall risk  Low fall risk     Patient at Risk for Falls Due to   No Fall Risks No Fall Risks  Fall risk Follow up   Falls evaluation completed Falls evaluation completed     Data saved with a previous flowsheet row definition   Functional Status Survey:    Vitals:   11/08/23 0936  BP: 134/82  Pulse: 79  Resp: 19  Temp: 97.9 F (36.6 C)  SpO2: 98%  Weight: 254 lb 12.8 oz (115.6 kg)  Height: 5' 9 (1.753 m)   Body mass index is 37.63 kg/m. Physical Exam Physical Exam    MEASUREMENTS: Weight- 254. GENERAL: Alert, cooperative, well developed, no acute distress. HEENT: Normocephalic, normal oropharynx, moist mucous membranes. CHEST: Clear to auscultation bilaterally, no wheezes, rhonchi, or crackles. CARDIOVASCULAR: Normal heart rate and rhythm, S1 and S2 normal without murmurs. ABDOMEN: Soft, non-tender, non-distended, without organomegaly, normal bowel sounds. EXTREMITIES: No cyanosis or edema. MUSCULOSKELETAL: Back non-tender, right and left leg raise without pain. NEUROLOGICAL: Cranial nerves grossly intact, moves all extremities without gross motor or sensory deficit, no numbness or tingling in legs.   Labs reviewed: Recent Labs    05/14/23 1524  NA 143  K 4.5  CL 107  CO2 28  GLUCOSE 87  BUN 13  CREATININE 1.27  CALCIUM  9.5   Recent Labs    05/14/23 1524  AST 29  ALT 34  BILITOT 0.8  PROT 8.0   Recent Labs    05/14/23 1524  WBC 9.0  NEUTROABS 4,932  HGB 13.8  HCT 40.3  MCV 89.2  PLT 245   Lab  Results  Component Value Date   TSH 0.87 05/14/2023   Lab Results  Component Value Date   HGBA1C 6.2 (H) 05/14/2023   Lab Results  Component Value Date   CHOL 145 05/14/2023   HDL 29 (L) 05/14/2023   LDLCALC 78 05/14/2023   TRIG 291 (H) 05/14/2023   CHOLHDL 5.0 (H) 05/14/2023    Significant Diagnostic Results in last 30 days:  No results found.  Assessment/Plan  Low back pain Intermittent low back pain since Saturday, located in the middle of the lower back, rated 6/10 in severity. Pain is exacerbated by lying down and relieved by walking. No tenderness on examination, and no signs of nerve impingement. Likely muscular or inflammatory in nature. Differential includes muscular strain or inflammation. No signs of sciatica or nerve compression. - Order x-ray of the lower back at [facility] - Prescribe prednisone  20 mg, take two tablets daily for five days - Advise use of warm compresses and heating pad with a towel to  prevent burns - Continue muscle relaxant as needed - Advise stretching exercises for the lower back - Discuss potential need for a new mattress or rotating the current one - Provide work excuse letter for today return to work 11/09/2023   Pure hyperglyceridemia Elevated triglycerides managed with fenofibrate . Reports weight management efforts and dietary changes to improve cholesterol levels. - Ensure continuation of fenofibrate  for triglyceride management - Encourage continued healthy diet and weight management  Seasonal allergic rhinitis Ongoing management with loratadine  and Flonase . No new symptoms reported. - Continue loratadine  10 mg daily - Continue Flonase  nasal spray as needed   Family/ staff Communication: Reviewed plan of care with patient verbalized understanding   Labs/tests ordered:  Dg lumbar   Next Appointment: Return if symptoms worsen or fail to improve.   Total time: 22 minutes. Greater than 50% of total time spent doing patient education regarding Lower back pain,Hypertriglyceridemias,seasonal allergies, health maintenance including symptom/medication management.   Roxan JAYSON Plough, NP

## 2024-05-19 ENCOUNTER — Ambulatory Visit: Payer: Self-pay | Admitting: Family
